# Patient Record
Sex: Female | Born: 1970
Health system: Southern US, Community
[De-identification: ages and names within clinical notes are randomized; demographics above are authoritative.]

## PROBLEM LIST (undated history)

## (undated) DIAGNOSIS — T7840XA Allergy, unspecified, initial encounter: Secondary | ICD-10-CM

## (undated) HISTORY — DX: Allergy, unspecified, initial encounter: T78.40XA

## (undated) HISTORY — PX: EYE SURGERY: SHX253

---

## 2007-01-06 ENCOUNTER — Ambulatory Visit (HOSPITAL_COMMUNITY): Admission: RE | Admit: 2007-01-06 | Discharge: 2007-01-06 | Payer: Self-pay | Admitting: *Deleted

## 2007-01-20 ENCOUNTER — Ambulatory Visit (HOSPITAL_COMMUNITY): Admission: RE | Admit: 2007-01-20 | Discharge: 2007-01-20 | Payer: Self-pay | Admitting: *Deleted

## 2007-06-15 ENCOUNTER — Inpatient Hospital Stay (HOSPITAL_COMMUNITY): Admission: AD | Admit: 2007-06-15 | Discharge: 2007-06-18 | Payer: Self-pay | Admitting: Obstetrics and Gynecology

## 2007-06-15 ENCOUNTER — Encounter (INDEPENDENT_AMBULATORY_CARE_PROVIDER_SITE_OTHER): Payer: Self-pay | Admitting: Obstetrics and Gynecology

## 2010-09-10 ENCOUNTER — Other Ambulatory Visit: Payer: Self-pay | Admitting: Emergency Medicine

## 2010-09-10 ENCOUNTER — Ambulatory Visit
Admission: RE | Admit: 2010-09-10 | Discharge: 2010-09-10 | Disposition: A | Discharge status: Home / Self Care | Payer: BC Managed Care – PPO | Source: Ambulatory Visit | Attending: Emergency Medicine | Admitting: Emergency Medicine

## 2010-09-10 DIAGNOSIS — R911 Solitary pulmonary nodule: Secondary | ICD-10-CM

## 2010-10-02 NOTE — H&P (Signed)
NAME:  Misty Choi, Misty Choi            ACCOUNT NO.:  [id number]   MEDICAL RECORD NO.:  [id number]          PATIENT TYPE:  INP   LOCATION:  9168                          FACILITY:  [facility]   PHYSICIAN:  Lenoard Aden, M.D.DATE OF BIRTH:  January 18, 1971   DATE OF ADMISSION:  06/15/2007  DATE OF DISCHARGE:                              HISTORY & PHYSICAL   CHIEF COMPLAINT:  BPP 6/10.   HISTORY OF PRESENT ILLNESS:  She is a 40 year old Hispanic female G1, P0  at [redacted] weeks gestation with an AFI of 3.2, nonreactive NST and a BPP at  6/10 for induction.   MEDICATIONS:  Prenatal vitamins.   PAST MEDICAL HISTORY:  1. History of wisdom tooth extraction.  2. LEEP.   SOCIAL HISTORY:  She is a nonsmoker, nondrinker, denies domestic or  physical violence.  Social  history is noncontributory.   FAMILY HISTORY:  Hypertension, stroke, diabetes, thyroid disease.   PRENATAL COURSE:  Uncomplicated to date.   PHYSICAL EXAMINATION:  GENERAL APPEARANCE:  She is a well-developed,  well-nourished, Hispanic female in no acute distress.  HEENT:  Normal.  LUNGS:  Clear.  CARDIOVASCULAR:  Regular rate and rhythm.  ABDOMEN:  Soft, gravid and nontender.  Estimated fetal weight 7-1/2  pounds.  PELVIC:  Cervix is closed, 80%, vertex, 0 station.  EXTREMITIES:  No cords.  NEUROLOGIC:  Nonfocal.  SKIN:  Intact.   IMPRESSION:  1. A 39-week intrauterine pregnancy.  2. Nonreactive non-stress test with biophysical profile 6/10 and      oligohydramnios for induction.   PLAN:  Proceed with induction.  Risks and benefits discussed.      Lenoard Aden, M.D.  Electronically Signed     RJT/MEDQ  D:  06/15/2007  T:  06/15/2007  Job:  [id number]

## 2010-10-02 NOTE — Op Note (Signed)
NAME:  Misty Choi, Misty Choi            ACCOUNT NO.:  [id number]   MEDICAL RECORD NO.:  [id number]          PATIENT TYPE:  INP   LOCATION:  9148                          [facility]ILITY:  [facility]   PHYSICIAN:  Lenoard Aden, M.D.DATE OF BIRTH:  [birth year]   DATE OF PROCEDURE:  06/15/2007  DATE OF DISCHARGE:                               OPERATIVE REPORT   PREOPERATIVE DIAGNOSES:  Term intrauterine pregnancy, nonreassuring  fetal heart rate tracing.   POSTOPERATIVE DIAGNOSES:  Term intrauterine pregnancy, nonreassuring  fetal heart rate tracing.   PROCEDURE:  Urgent low segment transverse cesarean section.   SURGEON:  Taavon.   ASSISTANT:  Fredric Mare.   ANESTHESIA:  Spinal by Jean Rosenthal.   ESTIMATED BLOOD LOSS:  1000 mL.   COMPLICATIONS:  None.   DRAINS:  Foley.   COUNTS:  Correct.   Patient to recovery in good condition.   FINDINGS:  Full-term living female, Apgars 8 and 49.  Cord pH 7.26.  Placenta to Pathology.  Terminal meconium noted.   BRIEF OPERATIVE NOTE:  After being apprised of the risks of anesthesia,  infection, bleeding, injury of abdominal organs, need for repair,  delayed versus immediate complications including bowel or bladder  injury, the patient was brought to the operating room where she was  administered a spinal anesthetic without complications, prepped and  draped in the usual sterile fashion.  Foley catheter was placed.  A  Pfannenstiel incision skin incision made with the scalpel, carried down  to fascia  which was nicked in the midline and entered transversely  using Mayo scissors.  Rectus muscles dissected sharply in the midline,  peritoneum entered sharply and bladder blade placed.  Visceral  peritoneum scored sharply off the lower segment uterine segment.  Kerr  hysterotomy incision made.  Atraumatic delivery of a full-term living  female as noted, handed to pediatrician in attendance.  Apgars 9.  Cord  blood collected.  Placenta delivered manually intact.   Three-vessel cord  noted.  Uterus exteriorized, curetted using dry lap pack and closed in  two running imbricating layers of 0 Monocryl suture.  Good hemostasis  was noted.  After achieving adequate hemostasis, the bladder flap was  inspected, found to be hemostatic.  Irrigation accomplished.  Fascia closed using a 0 Monocryl in a continuous running fashion.  Subcutaneous tissue reapproximated using a 0 plain in a continuous  running fashion.  Skin closed using staples.  The patient tolerated the  procedure well and transferred to recovery in good condition.      Lenoard Aden, M.D.  Electronically Signed     RJT/MEDQ  D:  06/15/2007  T:  06/16/2007  Job:  [id number]

## 2010-10-05 NOTE — Discharge Summary (Signed)
NAME:  Misty Choi, Misty Choi            ACCOUNT NO.:  [id number]   MEDICAL RECORD NO.:  [id number]          PATIENT TYPE:  INP   LOCATION:  9148                          [facility]ILITY:  [facility]   PHYSICIAN:  Lenoard Aden, M.D.DATE OF BIRTH:  July 23, 1970   DATE OF ADMISSION:  06/15/2007  DATE OF DISCHARGE:  06/18/2007                               DISCHARGE SUMMARY   The patient underwent uncomplicated repeat low-segment transverse  cesarean.  Postoperative course uncomplicated.  Discharged to home on  day 3.  Discharge teaching done.   Tylox, prenatal vitamins, and iron given.   Follow up in the office in 4-6 weeks.      Lenoard Aden, M.D.  Electronically Signed     RJT/MEDQ  D:  07/31/2007  T:  08/01/2007  Job:  [id number]

## 2011-02-07 LAB — CBC
Hemoglobin: 13.1
MCHC: 34.4
MCHC: 35.3
MCV: 94.5
MCV: 95.4
Platelets: 192
RDW: 13.9
RDW: 13.9
WBC: 10.5

## 2011-02-07 LAB — CCBB MATERNAL DONOR DRAW

## 2011-08-08 ENCOUNTER — Ambulatory Visit (INDEPENDENT_AMBULATORY_CARE_PROVIDER_SITE_OTHER): Payer: BC Managed Care – PPO | Admitting: Physician Assistant

## 2011-08-08 ENCOUNTER — Encounter: Payer: Self-pay | Admitting: Physician Assistant

## 2011-08-08 VITALS — BP 124/77 | HR 105 | Temp 100.8°F | Resp 16 | Ht 63.5 in | Wt 193.6 lb

## 2011-08-08 DIAGNOSIS — R300 Dysuria: Secondary | ICD-10-CM

## 2011-08-08 DIAGNOSIS — R509 Fever, unspecified: Secondary | ICD-10-CM

## 2011-08-08 DIAGNOSIS — N949 Unspecified condition associated with female genital organs and menstrual cycle: Secondary | ICD-10-CM

## 2011-08-08 DIAGNOSIS — N390 Urinary tract infection, site not specified: Secondary | ICD-10-CM

## 2011-08-08 LAB — POCT CBC
Hemoglobin: 13.8 g/dL (ref 12.2–16.2)
MCH, POC: 30.3 pg (ref 27–31.2)
MCHC: 32.9 g/dL (ref 31.8–35.4)
MID (cbc): 0.6 (ref 0–0.9)
MPV: 9.2 fL (ref 0–99.8)
POC Granulocyte: 4.1 (ref 2–6.9)
POC MID %: 10.4 %M (ref 0–12)
Platelet Count, POC: 264 10*3/uL (ref 142–424)
RBC: 4.55 M/uL (ref 4.04–5.48)

## 2011-08-08 LAB — POCT URINALYSIS DIPSTICK
Glucose, UA: NEGATIVE
Nitrite, UA: NEGATIVE
Protein, UA: NEGATIVE
Spec Grav, UA: 1.015
Urobilinogen, UA: 0.2
pH, UA: 6

## 2011-08-08 LAB — POCT UA - MICROSCOPIC ONLY
Casts, Ur, LPF, POC: NEGATIVE
Crystals, Ur, HPF, POC: NEGATIVE
Yeast, UA: NEGATIVE

## 2011-08-08 MED ORDER — NITROFURANTOIN MONOHYD MACRO 100 MG PO CAPS: 100.0000 mg | ORAL_CAPSULE | Freq: Two times a day (BID) | ORAL | Status: AC

## 2011-08-08 MED ORDER — AZITHROMYCIN 500 MG PO TABS: ORAL_TABLET | ORAL | Status: DC

## 2011-08-08 MED ORDER — CEFIXIME 400 MG PO TABS: 400.0000 mg | ORAL_TABLET | Freq: Every day | ORAL | Status: AC

## 2011-08-08 NOTE — Progress Notes (Signed)
  Subjective:    Patient ID: Misty Choi, female    DOB: [birth year], 42 y.o.   MRN: [id number]  HPI 41 yo female c/o 2 week h/o general body aches. Also she has been having some mild dysuria. No cough.  No night sweats.  Didn't develop fever until today.  Review of Systems  All other systems reviewed and are negative.       Objective:   Physical Exam  Nursing note and vitals reviewed. Constitutional: She is oriented to person, place, and time. She appears well-developed and well-nourished.  HENT:  Head: Normocephalic and atraumatic.  Mouth/Throat: Oropharynx is clear and moist.  Neck: Normal range of motion. Neck supple. No thyromegaly present.  Cardiovascular: Normal rate, regular rhythm and normal heart sounds.   Pulmonary/Chest: Effort normal and breath sounds normal. No respiratory distress. She has no wheezes. She has no rales.  Abdominal: Soft. Bowel sounds are normal. She exhibits no distension. There is no tenderness (no CVA tenderness).  Lymphadenopathy:    She has no cervical adenopathy.  Neurological: She is alert and oriented to person, place, and time.  Skin: Skin is warm and dry.      Results for orders placed in visit on 08/08/11  POCT URINALYSIS DIPSTICK      Component Value Range   Color, UA yellow     Clarity, UA slightly cloudy     Glucose, UA neg     Bilirubin, UA neg     Ketones, UA neg     Spec Grav, UA 1.015     Blood, UA trace     pH, UA 6.0     Protein, UA neg     Urobilinogen, UA 0.2     Nitrite, UA neg     Leukocytes, UA Trace    POCT UA - MICROSCOPIC ONLY      Component Value Range   WBC, Ur, HPF, POC 0-2     RBC, urine, microscopic 4.5     Bacteria, U Microscopic 2+     Mucus, UA neg     Epithelial cells, urine per micros 0-2     Crystals, Ur, HPF, POC neg     Casts, Ur, LPF, POC neg     Yeast, UA neg    POCT CBC      Component Value Range   WBC 5.4  4.6 - 10.2 (K/uL)   Lymph, poc 0.8  0.6 - 3.4    POC LYMPH PERCENT 14.6   10 - 50 (%L)   MID (cbc) 0.6  0 - 0.9    POC MID % 10.4  0 - 12 (%M)   POC Granulocyte 4.1  2 - 6.9    Granulocyte percent 75.0  37 - 80 (%G)   RBC 4.55  4.04 - 5.48 (M/uL)   Hemoglobin 13.8  12.2 - 16.2 (g/dL)   HCT, POC 40.9  81.1 - 47.9 (%)   MCV 92.2  80 - 97 (fL)   MCH, POC 30.3  27 - 31.2 (pg)   MCHC 32.9  31.8 - 35.4 (g/dL)   RDW, POC 91.4     Platelet Count, POC 264  142 - 424 (K/uL)   MPV 9.2  0 - 99.8 (fL)      Assessment & Plan:  ?UTI vs STI-Increase fluids See AVS/labs

## 2011-08-08 NOTE — Patient Instructions (Signed)
Increase fluids. To ER if worsens, RTC if new symptoms develop or you worsen.

## 2011-08-09 LAB — GC/CHLAMYDIA PROBE AMP, URINE: Chlamydia, Swab/Urine, PCR: NEGATIVE

## 2011-08-10 LAB — URINE CULTURE: Colony Count: >=100000

## 2012-04-24 ENCOUNTER — Ambulatory Visit (INDEPENDENT_AMBULATORY_CARE_PROVIDER_SITE_OTHER): Payer: BC Managed Care – PPO | Admitting: Emergency Medicine

## 2012-04-24 VITALS — BP 122/90 | HR 83 | Temp 98.3°F | Resp 16 | Ht 64.0 in | Wt 197.0 lb

## 2012-04-24 DIAGNOSIS — N3090 Cystitis, unspecified without hematuria: Secondary | ICD-10-CM

## 2012-04-24 DIAGNOSIS — B86 Scabies: Secondary | ICD-10-CM

## 2012-04-24 MED ORDER — PERMETHRIN 5 % EX CREA: TOPICAL_CREAM | Freq: Once | CUTANEOUS | Status: DC

## 2012-04-24 MED ORDER — TRIAMCINOLONE ACETONIDE 0.1 % EX CREA: TOPICAL_CREAM | Freq: Two times a day (BID) | CUTANEOUS | Status: DC

## 2012-04-24 NOTE — Patient Instructions (Addendum)
Scabies Scabies are small bugs (mites) that burrow under the skin and cause red bumps and severe itching. These bugs can only be seen with a microscope. Scabies are highly contagious. They can spread easily from person to person by direct contact. They are also spread through sharing clothing or linens that have the scabies mites living in them. It is not unusual for an entire family to become infected through shared towels, clothing, or bedding.  HOME CARE INSTRUCTIONS   Your caregiver may prescribe a cream or lotion to kill the mites. If cream is prescribed, massage the cream into the entire body from the neck to the bottom of both feet. Also massage the cream into the scalp and face if your child is less than 1 year old. Avoid the eyes and mouth. Do not wash your hands after application.  Leave the cream on for 8 to 12 hours. Your child should bathe or shower after the 8 to 12 hour application period. Sometimes it is helpful to apply the cream to your child right before bedtime.  One treatment is usually effective and will eliminate approximately 95% of infestations. For severe cases, your caregiver may decide to repeat the treatment in 1 week. Everyone in your household should be treated with one application of the cream.  New rashes or burrows should not appear within 24 to 48 hours after successful treatment. However, the itching and rash may last for 2 to 4 weeks after successful treatment. Your caregiver may prescribe a medicine to help with the itching or to help the rash go away more quickly.  Scabies can live on clothing or linens for up to 3 days. All of your child's recently used clothing, towels, stuffed toys, and bed linens should be washed in hot water and then dried in a dryer for at least 20 minutes on high heat. Items that cannot be washed should be enclosed in a plastic bag for at least 3 days.  To help relieve itching, bathe your child in a cool bath or apply cool washcloths to the  affected areas.  Your child may return to school after treatment with the prescribed cream. SEEK MEDICAL CARE IF:   The itching persists longer than 4 weeks after treatment.  The rash spreads or becomes infected. Signs of infection include red blisters or yellow-tan crust. Document Released: 05/06/2005 Document Revised: 07/29/2011 Document Reviewed: 09/14/2008 [employer] Patient Information 2013 [employer].  

## 2012-04-24 NOTE — Progress Notes (Signed)
[facility] [address] [place] [address] [phone]  Date:  04/24/2012   Name:  Misty Choi   DOB:  29-Sep-1970   MRN:  [id number]  PCP:  No primary provider on file.    Chief Complaint: Rash and Dizziness   History of Present Illness:  Misty Choi is a 41 y.o. very pleasant female patient who presents with the following:  Three day history of pruritic rash in axillae, arms, and feet.  Intensely pruritic.  No allergen contact.  Denies sick contacts.  There is no problem list on file for this patient.   History reviewed. No pertinent past medical history.  History reviewed. No pertinent past surgical history.  History  Substance Use Topics  . Smoking status: Never Smoker   . Smokeless tobacco: Not on file  . Alcohol Use: Yes    History reviewed. No pertinent family history.  Allergies  Allergen Reactions  . Nitrofurantoin Monohyd Macro Rash    Medication list has been reviewed and updated.  Current Outpatient Prescriptions on File Prior to Visit  Medication Sig Dispense Refill  . azithromycin (ZITHROMAX) 500 MG tablet 2 po with food today  2 tablet  0    Review of Systems:  As per HPI, otherwise negative.    Physical Examination: Filed Vitals:   04/24/12 1019  BP: 122/90  Pulse: 83  Temp: 98.3 F (36.8 C)  Resp: 16   Filed Vitals:   04/24/12 1019  Height: 5\' 4"  (1.626 m)  Weight: 197 lb (89.359 kg)   Body mass index is 33.82 kg/(m^2). Ideal Body Weight: Weight in (lb) to have BMI = 25: 145.3    GEN: WDWN, NAD, Non-toxic, Alert & Oriented x 3 HEENT: Atraumatic, Normocephalic.  Ears and Nose: No external deformity. EXTR: No clubbing/cyanosis/edema NEURO: Normal gait.  PSYCH: Normally interactive. Conversant. Not depressed or anxious appearing.  Calm demeanor.  SKIN:  1 mm diameter erythematous eruption. Coalescent.  Characteristic scabies  Assessment and Plan: Scabies elimite TAC Follow up as  needed  Carmelina Dane, MD

## 2012-04-27 NOTE — Progress Notes (Signed)
Reviewed and agree.

## 2012-10-30 ENCOUNTER — Ambulatory Visit (INDEPENDENT_AMBULATORY_CARE_PROVIDER_SITE_OTHER): Payer: BC Managed Care – PPO | Admitting: Physician Assistant

## 2012-10-30 VITALS — BP 121/86 | HR 77 | Temp 98.9°F | Resp 18 | Wt 199.0 lb

## 2012-10-30 DIAGNOSIS — R300 Dysuria: Secondary | ICD-10-CM

## 2012-10-30 LAB — POCT URINALYSIS DIPSTICK
Ketones, UA: NEGATIVE
Nitrite, UA: NEGATIVE
Protein, UA: NEGATIVE
pH, UA: 5.5

## 2012-10-30 LAB — POCT UA - MICROSCOPIC ONLY
Crystals, Ur, HPF, POC: NEGATIVE
Yeast, UA: NEGATIVE

## 2012-10-30 MED ORDER — PHENAZOPYRIDINE HCL 200 MG PO TABS: 200.0000 mg | ORAL_TABLET | Freq: Three times a day (TID) | ORAL | Status: DC | PRN

## 2012-10-30 MED ORDER — CIPROFLOXACIN HCL 500 MG PO TABS: 500.0000 mg | ORAL_TABLET | Freq: Two times a day (BID) | ORAL | Status: DC

## 2012-10-30 NOTE — Patient Instructions (Addendum)
Begin taking the antibiotic as directed.  Be sure to finish the full course.  You may take pyridium every 8 hours if needed today and tomorrow to help with symptoms.  Plenty of fluids (water is best!)  I will let you know when your culture is back and if we need to make any changed based on that.  Please let me know if any symptoms are worsening or not improving.   Urinary Tract Infection Urinary tract infections (UTIs) can develop anywhere along your urinary tract. Your urinary tract is your body's drainage system for removing wastes and extra water. Your urinary tract includes two kidneys, two ureters, a bladder, and a urethra. Your kidneys are a pair of bean-shaped organs. Each kidney is about the size of your fist. They are located below your ribs, one on each side of your spine. CAUSES Infections are caused by microbes, which are microscopic organisms, including fungi, viruses, and bacteria. These organisms are so small that they can only be seen through a microscope. Bacteria are the microbes that most commonly cause UTIs. SYMPTOMS  Symptoms of UTIs may vary by age and gender of the patient and by the location of the infection. Symptoms in young women typically include a frequent and intense urge to urinate and a painful, burning feeling in the bladder or urethra during urination. Older women and men are more likely to be tired, shaky, and weak and have muscle aches and abdominal pain. A fever may mean the infection is in your kidneys. Other symptoms of a kidney infection include pain in your back or sides below the ribs, nausea, and vomiting. DIAGNOSIS To diagnose a UTI, your caregiver will ask you about your symptoms. Your caregiver also will ask to provide a urine sample. The urine sample will be tested for bacteria and white blood cells. White blood cells are made by your body to help fight infection. TREATMENT  Typically, UTIs can be treated with medication. Because most UTIs are caused by a  bacterial infection, they usually can be treated with the use of antibiotics. The choice of antibiotic and length of treatment depend on your symptoms and the type of bacteria causing your infection. HOME CARE INSTRUCTIONS  If you were prescribed antibiotics, take them exactly as your caregiver instructs you. Finish the medication even if you feel better after you have only taken some of the medication.  Drink enough water and fluids to keep your urine clear or pale yellow.  Avoid caffeine, tea, and carbonated beverages. They tend to irritate your bladder.  Empty your bladder often. Avoid holding urine for long periods of time.  Empty your bladder before and after sexual intercourse.  After a bowel movement, women should cleanse from front to back. Use each tissue only once. SEEK MEDICAL CARE IF:   You have back pain.  You develop a fever.  Your symptoms do not begin to resolve within 3 days. SEEK IMMEDIATE MEDICAL CARE IF:   You have severe back pain or lower abdominal pain.  You develop chills.  You have nausea or vomiting.  You have continued burning or discomfort with urination. MAKE SURE YOU:   Understand these instructions.  Will watch your condition.  Will get help right away if you are not doing well or get worse. Document Released: 02/13/2005 Document Revised: 11/05/2011 Document Reviewed: 06/14/2011 [facility] Patient Information 2014 [place], [place].

## 2012-10-30 NOTE — Progress Notes (Signed)
Subjective:    Patient ID: Misty Choi, female    DOB: 11/24/1970, 42 y.o.   MRN: [id number]  HPI   Ms. Misty Choi is a very pleasant 42 yr old female here with concern for bladder infection.  States she has had about 3 days of burning with urination and frequency.  Has also felt fatigued.  Abnormal odor to urine.  Some lower abd pain.  No back pain, fever, or chills.  A little nausea but no vomiting.  No hematuria.  No vaginal symptoms or concern for STI.    Has had UTIs in the past - maybe 1 every couple years.  Symptoms today feel similar to previous UTIs.   Review of Systems  Constitutional: Positive for fatigue. Negative for fever and chills.  HENT: Negative.   Respiratory: Negative.   Cardiovascular: Negative.   Gastrointestinal: Positive for nausea and abdominal pain (lower). Negative for vomiting.  Genitourinary: Positive for dysuria, urgency and frequency. Negative for hematuria, flank pain and vaginal discharge.  Musculoskeletal: Negative.   Skin: Negative.   Neurological: Negative.        Objective:   Physical Exam  Vitals reviewed. Constitutional: She is oriented to person, place, and time. She appears well-developed and well-nourished. No distress.  HENT:  Head: Normocephalic and atraumatic.  Eyes: Conjunctivae are normal. No scleral icterus.  Cardiovascular: Normal rate, regular rhythm and normal heart sounds.  Exam reveals no gallop and no friction rub.   No murmur heard. Pulmonary/Chest: Effort normal and breath sounds normal. She has no wheezes. She has no rales.  Abdominal: Soft. Bowel sounds are normal. There is tenderness in the suprapubic area. There is no rigidity, no rebound, no guarding, no CVA tenderness, no tenderness at McBurney's point and negative Murphy's sign.  Neurological: She is alert and oriented to person, place, and time.  Skin: Skin is warm and dry.  Psychiatric: She has a normal mood and affect. Her behavior is normal.    Results for  orders placed in visit on 10/30/12  POCT UA - MICROSCOPIC ONLY      Result Value Range   WBC, Ur, HPF, POC 8-12     RBC, urine, microscopic 1-3     Bacteria, U Microscopic small     Mucus, UA neg     Epithelial cells, urine per micros 1-3     Crystals, Ur, HPF, POC neg     Casts, Ur, LPF, POC neg     Yeast, UA neg    POCT URINALYSIS DIPSTICK      Result Value Range   Color, UA yellow     Clarity, UA hazy     Glucose, UA neg     Bilirubin, UA neg     Ketones, UA neg     Spec Grav, UA <=1.005     Blood, UA 5.5     pH, UA 5.5     Protein, UA neg     Urobilinogen, UA 0.2     Nitrite, UA neg     Leukocytes, UA small (1+)           Assessment & Plan:  Dysuria - Plan: POCT UA - Microscopic Only, POCT urinalysis dipstick, Urine culture, ciprofloxacin (CIPRO) 500 MG tablet, phenazopyridine (PYRIDIUM) 200 MG tablet   Ms. Misty Choi is a very pleasant 42 yr old female with 3 days of UTI symptoms.  UA shows only 1+ leuks, but symptoms are certainly suggestive of infection.  Will send urine cx and start  abx empirically.  Pt has an allergy to macrobid, so will treat with Cipro BID x 5 days.  Pyridium q8h prn symptoms for the next 48 hours.  Push fluids.  RTC precautions.  Will follow up on culture data and adjust therapy if necessary.

## 2012-11-01 LAB — URINE CULTURE

## 2014-07-06 DIAGNOSIS — Z8619 Personal history of other infectious and parasitic diseases: Secondary | ICD-10-CM | POA: Insufficient documentation

## 2014-07-28 ENCOUNTER — Ambulatory Visit (INDEPENDENT_AMBULATORY_CARE_PROVIDER_SITE_OTHER): Payer: 59 | Admitting: Physician Assistant

## 2014-07-28 VITALS — BP 150/80 | HR 117 | Temp 99.7°F | Resp 16 | Ht 64.0 in | Wt 193.0 lb

## 2014-07-28 DIAGNOSIS — J069 Acute upper respiratory infection, unspecified: Secondary | ICD-10-CM | POA: Diagnosis not present

## 2014-07-28 DIAGNOSIS — R350 Frequency of micturition: Secondary | ICD-10-CM

## 2014-07-28 DIAGNOSIS — R6889 Other general symptoms and signs: Secondary | ICD-10-CM | POA: Diagnosis not present

## 2014-07-28 DIAGNOSIS — B9789 Other viral agents as the cause of diseases classified elsewhere: Secondary | ICD-10-CM

## 2014-07-28 LAB — POCT INFLUENZA A/B
INFLUENZA B, POC: NEGATIVE
Influenza A, POC: NEGATIVE

## 2014-07-28 LAB — POCT URINALYSIS DIPSTICK
BILIRUBIN UA: NEGATIVE
Blood, UA: NEGATIVE
Glucose, UA: NEGATIVE
KETONES UA: NEGATIVE
Leukocytes, UA: NEGATIVE
NITRITE UA: NEGATIVE
PH UA: 8.5
Protein, UA: NEGATIVE
SPEC GRAV UA: 1.02
UROBILINOGEN UA: 0.2

## 2014-07-28 LAB — POCT UA - MICROSCOPIC ONLY
Bacteria, U Microscopic: NEGATIVE
CASTS, UR, LPF, POC: NEGATIVE
CRYSTALS, UR, HPF, POC: NEGATIVE
MUCUS UA: NEGATIVE
RBC, URINE, MICROSCOPIC: NEGATIVE
YEAST UA: NEGATIVE

## 2014-07-28 MED ORDER — GUAIFENESIN ER 1200 MG PO TB12: 1.0000 | ORAL_TABLET | Freq: Two times a day (BID) | ORAL | Status: AC

## 2014-07-28 MED ORDER — BENZONATATE 100 MG PO CAPS: ORAL_CAPSULE | ORAL | Status: AC

## 2014-07-28 MED ORDER — HYDROCOD POLST-CHLORPHEN POLST 10-8 MG/5ML PO LQCR: 5.0000 mL | Freq: Two times a day (BID) | ORAL | Status: AC | PRN

## 2014-07-28 NOTE — Progress Notes (Signed)
Subjective:    Patient ID: Misty Choi, female    DOB: 1970-08-12, 44 y.o.   MRN: [id number]  HPI Pt presents to clinic with 12 hours of feeling poorly - it started abruptly and has continued to worsened.  She has mild congestion but fevers and chills and myalgias are the worse.  She has a cough that is only productive in the am.   She has also noticed that she has been urinating more than normal this am but she is having no other urinary symptoms.  No flu vaccine this year  No OTC meds Works in an elementary school  No h/o asthma and she is not a smoker  Review of Systems  Constitutional: Positive for fever (Tmax 100.7) and chills.  HENT: Positive for congestion and rhinorrhea (clear). Negative for sore throat.   Respiratory: Positive for cough (light yellow - worse in the am).   Gastrointestinal: Negative for nausea, vomiting and diarrhea.  Musculoskeletal: Positive for myalgias.  Neurological: Negative for headaches.  Psychiatric/Behavioral: Negative for sleep disturbance.   There are no active problems to display for this patient.  Prior to Admission medications   Not on File   Allergies  Allergen Reactions  . Azithromycin Rash  . Nitrofurantoin Monohyd Macro Rash    Medications, allergies, past medical history, surgical history, family history, social history and problem list reviewed and updated.      Objective:   Physical Exam  Constitutional: She is oriented to person, place, and time. She appears well-developed and well-nourished.  BP 150/80 mmHg  Pulse 117  Temp(Src) 99.7 F (37.6 C) (Oral)  Resp 16  Ht  (1.626 m)  Wt 193 lb (87.544 kg)  BMI 33.11 kg/m2  SpO2 97%  LMP 07/19/2014   HENT:  Head: Normocephalic and atraumatic.  Right Ear: External ear normal.  Left Ear: External ear normal.  Eyes: Conjunctivae are normal.  Neck: Normal range of motion.  Cardiovascular: Normal rate, regular rhythm and normal heart sounds.   No murmur  heard. Pulmonary/Chest: Effort normal and breath sounds normal. She has no wheezes.  Deep sounding cough  Lymphadenopathy:    She has cervical adenopathy (AC enlarged but not TTP bilaterally).  Neurological: She is alert and oriented to person, place, and time.  Skin: Skin is warm and dry.  Psychiatric: She has a normal mood and affect. Her behavior is normal. Judgment and thought content normal.   Results for orders placed or performed in visit on 07/28/14  POCT Influenza A/B  Result Value Ref Range   Influenza A, POC Negative    Influenza B, POC Negative   POCT urinalysis dipstick  Result Value Ref Range   Color, UA yellow    Clarity, UA clear    Glucose, UA neg    Bilirubin, UA neg    Ketones, UA neg    Spec Grav, UA 1.020    Blood, UA neg    pH, UA 8.5    Protein, UA neg    Urobilinogen, UA 0.2    Nitrite, UA neg    Leukocytes, UA Negative   POCT UA - Microscopic Only  Result Value Ref Range   WBC, Ur, HPF, POC 0-1    RBC, urine, microscopic neg    Bacteria, U Microscopic neg    Mucus, UA neg    Epithelial cells, urine per micros 0-2    Crystals, Ur, HPF, POC neg    Casts, Ur, LPF, POC neg  Yeast, UA neg       Assessment & Plan:  Flu-like symptoms - Plan: POCT Influenza A/B  Urinary frequency - Plan: POCT urinalysis dipstick, POCT UA - Microscopic Only  Viral URI with cough - Plan: Guaifenesin (MUCINEX MAXIMUM STRENGTH) 1200 MG TB12, benzonatate (TESSALON) 100 MG capsule, chlorpheniramine-HYDROcodone (TUSSIONEX PENNKINETIC ER) 10-8 MG/5ML [facility]  Symptomatic care  Benny LennertSarah Adelise Buswell PA-C  [facility] 07/28/2014 6:29 PM

## 2014-07-28 NOTE — Patient Instructions (Signed)
Please push fluids.  Tylenol and Motrin for fever and body aches.    

## 2015-07-18 ENCOUNTER — Ambulatory Visit (INDEPENDENT_AMBULATORY_CARE_PROVIDER_SITE_OTHER): Payer: Self-pay | Admitting: Urgent Care

## 2015-07-18 VITALS — BP 120/80 | HR 106 | Temp 99.2°F | Resp 16 | Ht 63.78 in | Wt 201.8 lb

## 2015-07-18 DIAGNOSIS — S0082XA Blister (nonthermal) of other part of head, initial encounter: Secondary | ICD-10-CM

## 2015-07-18 DIAGNOSIS — R51 Headache: Secondary | ICD-10-CM

## 2015-07-18 DIAGNOSIS — L539 Erythematous condition, unspecified: Secondary | ICD-10-CM

## 2015-07-18 DIAGNOSIS — S00521A Blister (nonthermal) of lip, initial encounter: Secondary | ICD-10-CM

## 2015-07-18 DIAGNOSIS — R519 Headache, unspecified: Secondary | ICD-10-CM

## 2015-07-18 MED ORDER — ACYCLOVIR 800 MG PO TABS: 800.0000 mg | ORAL_TABLET | Freq: Every day | ORAL | Status: DC

## 2015-07-18 MED ORDER — TRAMADOL HCL 50 MG PO TABS: 50.0000 mg | ORAL_TABLET | Freq: Three times a day (TID) | ORAL | Status: DC | PRN

## 2015-07-18 MED ORDER — MUPIROCIN 2 % EX OINT: 1.0000 "application " | TOPICAL_OINTMENT | Freq: Three times a day (TID) | CUTANEOUS | Status: DC

## 2015-07-18 MED ORDER — VALACYCLOVIR HCL 1 G PO TABS: 1000.0000 mg | ORAL_TABLET | Freq: Three times a day (TID) | ORAL | Status: DC

## 2015-07-18 NOTE — Progress Notes (Signed)
    MRN: [id number] DOB: February 25, 1971  Subjective:   Misty Choi is a 45 y.o. female presenting for chief complaint of Rash and Mouth Lesions  Reports 3 day history of warmth over her left face, followed by blisters over her cheek that are tingling and painful, feels tightness around her neck, lymph node pain behind her left ear. Has not tried any medications for relief. Denies fever, sore throat, eye pain, eye redness, sinus pain, cough, chest pain, shob, chest tightness, n/v, abdominal pain. Denies spending time outdoors, new exposures including medications, foods and detergents. Admits history of chicken pox as a child. Patient works with kindergartners.   Gwenette currently has no medications in their medication list. Also is allergic to azithromycin and nitrofurantoin monohyd macro.  Zani  has a past medical history of Allergy. Also  has past surgical history that includes Eye surgery and Cesarean section.  Objective:   Vitals: BP 120/80 mmHg  Pulse 106  Temp(Src) 99.2 F (37.3 C) (Oral)  Resp 16  Ht 5' 3.78" (1.62 m)  Wt 201 lb 12.8 oz (91.536 kg)  BMI 34.88 kg/m2  SpO2 98%  LMP 06/20/2015  Physical Exam  Constitutional: She is oriented to person, place, and time. She appears well-developed and well-nourished.  HENT:  Head:    TM's intact bilaterally, no effusions or erythema. Nasal turbinates pink and moist, nasal passages patent. No sinus tenderness. Oropharynx clear, mucous membranes moist, dentition in good repair.  Eyes: Pupils are equal, round, and reactive to light. Right eye exhibits no discharge. Left eye exhibits no discharge. No scleral icterus.  Neck: Normal range of motion. Neck supple.  Cardiovascular: Normal rate, regular rhythm and intact distal pulses.   Pulmonary/Chest: No respiratory distress. She has no wheezes. She has no rales.  Lymphadenopathy:    She has cervical adenopathy (bilateral, L>R, anterior).  Neurological: She is alert and oriented  to person, place, and time.  Skin: Skin is warm and dry.   Assessment and Plan :   1. Facial erythema 2. Blister, face, initial encounter 3. Blister of lip without infection, initial encounter 4. Facial pain - Likely undergoing Shingles, offered scripts for Valtrex and Acyclovir, she will fill the more affordable option. Offered Ultram for pain and Bactroban if blisters become infected, I do not suspect that they are there yet. RTC in 1 week if no improvement. Patient agreed.  [name], PA-C [facility] [phone] 07/18/2015 1:20 PM

## 2015-07-18 NOTE — Patient Instructions (Signed)
Culebrilla  (Shingles)    La culebrilla, también conocida como herpes zóster, es una infección que causa una erupción dolorosa en la piel y ampollas llenas de líquido. La culebrilla no está relacionada con el herpes genital, que es una enfermedad de transmisión sexual.     Solo se manifiesta en personas que:  · Tuvieron varicela.  · Recibieron la vacuna contra la varicela. (Esto es poco frecuente).  CAUSAS  La causa de la culebrilla es el virus de la varicela zóster (VVZ), el mismo virus que causa la varicela. Después de la exposición al virus de la varicela zóster, este permanece en el organismo en un estado inactivo (latente). La culebrilla aparece si el virus se reactiva. Esto puede ocurrir muchos años después de la exposición inicial al virus. No se conocen las causas por las que este virus se reactiva.  FACTORES DE RIESGO  Las personas que tuvieron varicela o recibieron la vacuna contra la varicela están en riesgo de tener culebrilla. La infección es más frecuente en las personas que:  · Tienen más de 50 años.  · Tienen el sistema de defensa del organismo (sistema inmunitario) debilitado, como en los enfermos con VIH, sida o cáncer.  · Toman medicamentos que debilitan el sistema inmunitario, como los medicamentos para trasplantes.  · Están sometidas a un gran estrés.  SÍNTOMAS  Los primeros síntomas de esta afección pueden incluir picazón, hormigueo o dolor en una zona de la piel. Este dolor se puede describir como ardor, punzante o pulsátil.  Unos días o semanas después de que comienzan los síntomas, aparece una erupción rojiza y dolorosa en un lado del cuerpo en un patrón con forma de cinto o de banda. Finalmente, la erupción se convierte en ampollas llenas de líquido que se abren, forman costras y se secan en dos o tres semanas.  En cualquier momento durante la infección, puede presentar lo siguiente:  · Fiebre.  · Escalofríos.  · Dolor de cabeza.  · Malestar estomacal.  DIAGNÓSTICO  Esta afección se  diagnostica con un examen de la piel. En algunos casos, se extraen muestras de piel o del líquido de las ampollas antes de definir el diagnóstico. Estas muestras se examinan con el microscopio o se envían al laboratorio para su análisis.  TRATAMIENTO  No hay una cura específica para esta afección. El médico probablemente le recete medicamentos para ayudarlo a controlar el dolor, a recuperarse más rápido y a evitar problemas a largo plazo. Entre los medicamentos se incluyen los siguientes:  · Medicamentos antivirales.  · Antiinflamatorios.  · Analgésicos.  Si la zona afectada está en el rostro, podrán derivarlo a un especialista, como un médico especialista en ojos (oftalmólogo) o en oídos, nariz y garganta (otorrinolaringólogo) para evitar problemas oculares, dolor crónico o discapacidad.  INSTRUCCIONES PARA EL CUIDADO EN EL HOGAR  Medicamentos  · Tome los medicamentos solamente como se lo haya indicado el médico.  · Aplique una crema anestésica o una para calmar la picazón en la zona afectada según las indicaciones del médico.  Cuidado de la erupción y las ampollas  · Tome un baño de agua fría o aplique compresas frías en la zona de la erupción o las ampollas como se lo haya indicado el médico. Esto aliviará el dolor y la picazón.  · Mantenga la zona de la erupción cubierta con una venda (vendaje). Use ropa holgada para ayudar a aliviar el dolor del roce con la erupción.  · Mantenga la erupción y las ampollas limpias con jabón suave   y agua fresca o como se lo indique el médico.  · Controle la erupción todos los días para detectar signos de infección. Estos signos incluyen enrojecimiento, hinchazón y dolor que perdura o aumenta.  · No pellizque las ampollas.  · No se rasque la zona de la erupción.  Instrucciones generales  · Haga reposo según las indicaciones del médico.  · Concurra a todas las visitas de control como se lo haya indicado el médico. Esto es importante.  · Hasta tanto las ampollas formen costras, la  infección puede causar varicela en las personas que nunca la tuvieron o no se vacunaron contra la varicela. Para impedir que esto suceda, evite el contacto con otras personas, en especial:    Bebés.    Embarazadas.    Niños que tienen eccema.    Personas mayores que han recibido un trasplante.    Personas con enfermedades crónicas, como leucemia y sida.  SOLICITE ATENCIÓN MÉDICA SI:  · El dolor no se alivia con los medicamentos prescritos.  · El dolor no mejora después de que la erupción desaparece.  · La erupción parece infectada. Los signos de infección incluyen enrojecimiento, hinchazón y dolor que perdura o aumenta.  SOLICITE ATENCIÓN MÉDICA DE INMEDIATO SI:  · La erupción aparece en el rostro o la nariz.  · Tiene dolor en el rostro, en la zona de los ojos o tiene pérdida de la sensibilidad en un lado del rostro.  · Siente dolor o un zumbido en el oído.  · Tiene pérdida del gusto.  · La afección empeora.     Esta información no tiene como fin reemplazar el consejo del médico. Asegúrese de hacerle al médico cualquier pregunta que tenga.     Document Released: 02/13/2005 Document Revised: 05/27/2014  Elsevier Interactive Patient Education ©2016 Elsevier Inc.

## 2016-05-16 ENCOUNTER — Ambulatory Visit (INDEPENDENT_AMBULATORY_CARE_PROVIDER_SITE_OTHER): Payer: 59 | Admitting: Urgent Care

## 2016-05-16 VITALS — BP 118/76 | HR 105 | Temp 98.5°F | Resp 18 | Ht 63.78 in | Wt 199.0 lb

## 2016-05-16 DIAGNOSIS — R519 Headache, unspecified: Secondary | ICD-10-CM

## 2016-05-16 DIAGNOSIS — L539 Erythematous condition, unspecified: Secondary | ICD-10-CM

## 2016-05-16 DIAGNOSIS — R21 Rash and other nonspecific skin eruption: Secondary | ICD-10-CM

## 2016-05-16 DIAGNOSIS — R51 Headache: Secondary | ICD-10-CM | POA: Diagnosis not present

## 2016-05-16 LAB — POCT CBC
Granulocyte percent: 73.5 %G (ref 37–80)
HCT, POC: 39 % (ref 37.7–47.9)
HEMOGLOBIN: 13.9 g/dL (ref 12.2–16.2)
LYMPH, POC: 2.3 (ref 0.6–3.4)
MCH: 31.9 pg — AB (ref 27–31.2)
MCHC: 35.7 g/dL — AB (ref 31.8–35.4)
MCV: 89.5 fL (ref 80–97)
MID (CBC): 0.5 (ref 0–0.9)
MPV: 8.1 fL (ref 0–99.8)
PLATELET COUNT, POC: 308 10*3/uL (ref 142–424)
POC Granulocyte: 7.9 — AB (ref 2–6.9)
POC LYMPH PERCENT: 21.4 %L (ref 10–50)
POC MID %: 5.1 %M (ref 0–12)
RBC: 4.35 M/uL (ref 4.04–5.48)
RDW, POC: 13.2 %
WBC: 10.7 10*3/uL — AB (ref 4.6–10.2)

## 2016-05-16 MED ORDER — HYDROCODONE-ACETAMINOPHEN 5-325 MG PO TABS: 1.0000 | ORAL_TABLET | Freq: Four times a day (QID) | ORAL | 0 refills | Status: DC | PRN

## 2016-05-16 MED ORDER — VALACYCLOVIR HCL 1 G PO TABS: 1000.0000 mg | ORAL_TABLET | Freq: Three times a day (TID) | ORAL | 0 refills | Status: DC

## 2016-05-16 NOTE — Patient Instructions (Addendum)
Valacyclovir caplets Qu es este medicamento? El VALACICLOVIR es un medicamento antiviral. Se utiliza para tratar o prevenir infecciones provocadas por ciertos tipos de virus. Ejemplos de estas infecciones incluyen infecciones por herpes y [place]. Este medicamento no curar las infecciones de herpes. Este medicamento puede ser utilizado para otros usos; si tiene alguna pregunta consulte con su proveedor de atencin mdica o con su farmacutico. MARCAS COMUNES: Valtrex Qu le debo informar a mi profesional de la salud antes de tomar este medicamento? Necesita saber si usted presenta alguno de los siguientes problemas o situaciones: -sndrome de inmunodeficiencia adquirida (SIDA) -cualquier otra enfermedad que puede debilitar el sistema inmunolgico -transplante de mdula sea o rin -enfermedad renal -una reaccin alrgica o inusual al valaciclovir, aciclovir, ganciclovir, valganciclovir, a otros medicamentos, alimentos, colorantes o conservantes -si est embarazada o buscando quedar embarazada -si est amamantando a un beb Cmo debo utilizar este medicamento? Tome este medicamento por va oral con un vaso de agua. Siga las instrucciones de la etiqueta del [place]medicamento. Este medicamento se puede tomar con o sin alimentos. Tome sus dosis a intervalos regulares. No tome su medicamento con una frecuencia mayor a la indicada. Complete todo el tratamiento con el medicamento segn lo haya recetado su mdico o su profesional de la salud aun si considera que su problema ha mejorado. No deje de tomarlo excepto si as lo indica su mdico o su profesional de Beazer Homesla salud. Hable con su pediatra para informarse acerca del uso de este medicamento en nios. Aunque este medicamento ha sido recetado a nios tan menores como de 2 aos de edad para condiciones selectivas, las precauciones se aplican. Sobredosis: Pngase en contacto inmediatamente con un centro toxicolgico o una sala de urgencia si usted cree que  haya tomado demasiado medicamento. ATENCIN: Reynolds AmericanEste medicamento es solo para usted. No comparta este medicamento con nadie. Qu sucede si me olvido de una dosis? Si olvida una dosis, tmela lo antes posible. Si es casi la hora de la prxima dosis, tome slo esa dosis. No tome dosis adicionales o dobles. Qu puede interactuar con este medicamento? -cimetidina -probenecid Puede ser que esta lista no menciona todas las posibles interacciones. Informe a su profesional de Beazer Homesla salud de [employer]ctodos los productos a base de hierbas, medicamentos de Gidd[place]venta libre o suplementos nutritivos que est tomando. Si usted fuma, consume bebidas alcohlicas o si utiliza drogas ilegales, indqueselo tambin a su profesional de Beazer Homesla salud. Algunas sustancias pueden interactuar con su medicamento. A qu debo estar atento al usar PPL Corporationeste medicamento? Si los sntomas no mejoran despus de 1 semana, informe a su mdico o a su profesional de Beazer Homesla salud. Este medicamento es ms eficaz cuando se lo empieza a tomar en cuanto comienza la infeccin, durante las primeras 72 horas. Comience el tratamiento tan pronto como le sea posible despus de observar los primeros signos de infeccin como, hormigueo, picazn o dolor en la zona afectada. Es posible transmitir el herpes genital aun cuando no tiene sntomas. Siempre sigue prcticas de sexo seguro como usar preservativos de ltex o poliuretano cuando tiene contacto sexual. Debe mantenerse bien hidrato mientras est tomando este medicamento. Beba lquido en abundancia. Qu efectos secundarios puedo tener al Boston Scientificutilizar este medicamento? Efectos secundarios que debe informar a su mdico o a Producer, television/film/videosu profesional de la salud tan pronto como sea posible: -Therapist, artreacciones alrgicas como erupcin cutnea, picazn o urticarias, hinchazn de la cara, labios o lengua -comportamiento agresivo -confusin -alucinaciones -problemas de coordinacin, del habla, al caminar -dolor estomacal -temblor -dificultad para orinar  o  cambios en el volumen de orina Efectos secundarios que, por lo general, no requieren atencin mdica (debe informarlos a su mdico o a Producer, television/film/videosu profesional de la salud si persisten o si son molestos): -mareos -dolor de cabeza -nuseas, vmito Puede ser que esta lista no menciona todos los posibles efectos secundarios. Comunquese a su mdico por asesoramiento mdico Hewlett-Packardsobre los efectos secundarios. Usted puede informar los efectos secundarios a la FDA por telfono al 1-800-FDA-1088. Dnde debo guardar mi medicina? Mantngala fuera del alcance de los nios. Gurdela a Sanmina-SCItemperatura ambiente, entre 15 y 25 grados C (5959 y 6777 grados F). Mantenga el envase bien cerrado. Deseche todo el medicamento que no haya utilizado, despus de la fecha de vencimiento. ATENCIN: Este folleto es un resumen. Puede ser que no cubra toda la posible informacin. Si usted tiene preguntas acerca de esta medicina, consulte con su mdico, su farmacutico o su profesional de Radiographer, therapeuticla salud.  2017 Elsevier/Gold Standard (2014-06-28 00:00:00)     IF you received an x-ray today, you will receive an invoice from [facility]. Please contact [facility] at [phone] with questions or concerns regarding your invoice.   IF you received labwork today, you will receive an invoice from [facility]. Please contact [facility] at [phone] with questions or concerns regarding your invoice.   Our billing staff will not be able to assist you with questions regarding bills from these companies.  You will be contacted with the lab results as soon as they are available. The fastest way to get your results is to activate your My Chart account. Instructions are located on the last page of this paperwork. If you have not heard from us regarding the results in 2 weeks, please contact this office.

## 2016-05-16 NOTE — Progress Notes (Signed)
    MRN: [id number] DOB: 08/25/1970  Subjective:   Misty Choi is a 45 y.o. female presenting for chief complaint of Facial Pain (RIGHT)  Reports 1 day history of left sided facial rash and pain. She has had similar symptoms in the past. I treated her for Shingles empirically 06/2015. Also reports left sided neck lymph node pain. Denies eye pain, eye discharge, ear pain, ear discharge, cough, chest pain, n/v, abdominal pain. She has had 2 crowns placed, last f/u with her dentist was in November 2017, had reassuring findings.  Misty Choi has a current medication list which includes the following prescription(s): acyclovir, tramadol, and valacyclovir. Also is allergic to azithromycin and nitrofurantoin monohyd macro.  Misty Choi  has a past medical history of Allergy. Also  has a past surgical history that includes Eye surgery and Cesarean section.  Objective:   Vitals: BP 118/76 (BP Location: Right Arm, Patient Position: Sitting, Cuff Size: Large)   Pulse (!) 110   Temp 98.5 F (36.9 C) (Oral)   Resp 18   Ht 5' 3.78" (1.62 m)   Wt 199 lb (90.3 kg)   LMP 05/02/2016   SpO2 100%   BMI 34.39 kg/m   Physical Exam  Constitutional: She is oriented to person, place, and time. She appears well-developed and well-nourished.  HENT:  TM's intact bilaterally, no effusions or erythema. Nasal turbinates pink and moist, nasal passages patent. No sinus tenderness. Oropharynx clear, mucous membranes moist, dentition in good repair.  Eyes: Conjunctivae are normal. Pupils are equal, round, and reactive to light. Right eye exhibits no discharge. Left eye exhibits no discharge.  Neck: Normal range of motion. Neck supple.  Cardiovascular: Normal rate, regular rhythm and intact distal pulses.  Exam reveals no gallop and no friction rub.   No murmur heard. Pulmonary/Chest: No respiratory distress. She has no wheezes. She has no rales.  Lymphadenopathy:    She has no cervical adenopathy.  Neurological: She  is alert and oriented to person, place, and time.  Skin: Skin is warm and dry.   Results for orders placed or performed in visit on 05/16/16 (from the past 24 hour(s))  POCT CBC     Status: Abnormal   Collection Time: 05/16/16  3:15 PM  Result Value Ref Range   WBC 10.7 (A) 4.6 - 10.2 K/uL   Lymph, poc 2.3 0.6 - 3.4   POC LYMPH PERCENT 21.4 10 - 50 %L   MID (cbc) 0.5 0 - 0.9   POC MID % 5.1 0 - 12 %M   POC Granulocyte 7.9 (A) 2 - 6.9   Granulocyte percent 73.5 37 - 80 %G   RBC 4.35 4.04 - 5.48 M/uL   Hemoglobin 13.9 12.2 - 16.2 g/dL   HCT, POC 40.939.0 81.137.7 - 47.9 %   MCV 89.5 80 - 97 fL   MCH, POC 31.9 (A) 27 - 31.2 pg   MCHC 35.7 (A) 31.8 - 35.4 g/dL   RDW, POC 91.413.2 %   Platelet Count, POC 308 142 - 424 K/uL   MPV 8.1 0 - 99.8 fL   Assessment and Plan :   1. Facial rash 2. Facial pain 3. Facial erythema - Start Valtrex to cover for shingles. Labs pending. Use hydrocodone for moderate to severe pain. Recheck in 2 days if no improvement.  Wallis BambergMario Elisabetta Mishra, PA-C [facility] [phone] 05/16/2016 2:46 PM

## 2016-05-17 ENCOUNTER — Ambulatory Visit (INDEPENDENT_AMBULATORY_CARE_PROVIDER_SITE_OTHER): Payer: 59 | Admitting: Family Medicine

## 2016-05-17 VITALS — BP 132/90 | HR 89 | Temp 98.5°F | Resp 18 | Ht 63.78 in | Wt 195.0 lb

## 2016-05-17 DIAGNOSIS — B0222 Postherpetic trigeminal neuralgia: Secondary | ICD-10-CM | POA: Diagnosis not present

## 2016-05-17 DIAGNOSIS — R519 Headache, unspecified: Secondary | ICD-10-CM

## 2016-05-17 DIAGNOSIS — R51 Headache: Secondary | ICD-10-CM

## 2016-05-17 LAB — COMPREHENSIVE METABOLIC PANEL
A/G RATIO: 1.6 (ref 1.2–2.2)
ALBUMIN: 4.4 g/dL (ref 3.5–5.5)
ALK PHOS: 75 IU/L (ref 39–117)
ALT: 24 IU/L (ref 0–32)
AST: 19 IU/L (ref 0–40)
BILIRUBIN TOTAL: 0.3 mg/dL (ref 0.0–1.2)
BUN / CREAT RATIO: 15 (ref 9–23)
BUN: 10 mg/dL (ref 6–24)
CHLORIDE: 103 mmol/L (ref 96–106)
CO2: 21 mmol/L (ref 18–29)
Calcium: 9.3 mg/dL (ref 8.7–10.2)
Creatinine, Ser: 0.67 mg/dL (ref 0.57–1.00)
GFR calc non Af Amer: 106 mL/min/{1.73_m2} (ref >59)
GFR, EST AFRICAN AMERICAN: 122 mL/min/{1.73_m2} (ref >59)
GLOBULIN, TOTAL: 2.8 g/dL (ref 1.5–4.5)
GLUCOSE: 96 mg/dL (ref 65–99)
POTASSIUM: 4 mmol/L (ref 3.5–5.2)
SODIUM: 140 mmol/L (ref 134–144)
Total Protein: 7.2 g/dL (ref 6.0–8.5)

## 2016-05-17 LAB — ANA+ENA+DNA/DS+ANTICH+CENTR
ANA TITER 1: NEGATIVE
ENA RNP Ab: 0.3 AI (ref 0.0–0.9)
ENA SSA (RO) Ab: <0.2 AI (ref 0.0–0.9)
Scleroderma SCL-70: <0.2 AI (ref 0.0–0.9)
dsDNA Ab: <1 IU/mL (ref 0–9)

## 2016-05-17 LAB — SEDIMENTATION RATE: SED RATE: 10 mm/h (ref 0–32)

## 2016-05-17 LAB — HIV ANTIBODY (ROUTINE TESTING W REFLEX): HIV SCREEN 4TH GENERATION: NONREACTIVE

## 2016-05-17 LAB — C-REACTIVE PROTEIN: CRP: 2.5 mg/L (ref 0.0–4.9)

## 2016-05-17 MED ORDER — BACLOFEN 10 MG PO TABS: 10.0000 mg | ORAL_TABLET | Freq: Four times a day (QID) | ORAL | 0 refills | Status: DC

## 2016-05-17 MED ORDER — GABAPENTIN 300 MG PO CAPS: 300.0000 mg | ORAL_CAPSULE | Freq: Every day | ORAL | 1 refills | Status: DC

## 2016-05-17 MED ORDER — HYDROCODONE-ACETAMINOPHEN 5-325 MG PO TABS: 2.0000 | ORAL_TABLET | ORAL | 0 refills | Status: DC | PRN

## 2016-05-17 NOTE — Patient Instructions (Addendum)
Start taking half a tab of a baclofen 3 times a day. If you can tolerate it wean up to 1 tab 3 times a day and then up to 1 tab 4 times a day. This is most likely to cause some drowsiness and if you are on it for a long time can cause some withdrawal symptoms. However this has been shown to be more effective for the nerve pain then many other treatments. You can try the gabapentin before bed at night however I am unsure how effective this will be for trying geminal neuralgia though it tends to be quite effective for post herpetic pain so it is worth a trial. If you're having side effects of a.m. sedation this should gradually go away over the next several days to week area and this medicine is often gradually increased to 3-4 times a day if people can tolerate it.  Many times people are started on low doses of daily seizure medicine such as carbamazepine to prevent recurrence of this pain. This is often very well tolerated but I would recommend a visit with our neurologist and consideration of an MRI of the irritated nerve to ensure we are not overlooking anything.    IF you received an x-ray today, you will receive an invoice from [facility]. Please contact [facility] at [phone] with questions or concerns regarding your invoice.   IF you received labwork today, you will receive an invoice from [facility]. Please contact [facility] at [phone] with questions or concerns regarding your invoice.   Our billing staff will not be able to assist you with questions regarding bills from these companies.  You will be contacted with the lab results as soon as they are available. The fastest way to get your results is to activate your My Chart account. Instructions are located on the last page of this paperwork. If you have not heard from us regarding the results in 2 weeks, please contact this office.     Trigeminal Neuralgia Trigeminal neuralgia is a nerve disorder that causes  attacks of severe facial pain. The attacks last from a few seconds to several minutes. They can happen for days, weeks, or months and then go away for months or years. Trigeminal neuralgia is also called tic douloureux. What are the causes? This condition is caused by damage to a nerve in the face that is called the trigeminal nerve. An attack can be triggered by:  Talking.  Chewing.  Putting on makeup.  Washing your face.  Shaving your face.  Brushing your teeth.  Touching your face. What increases the risk? This condition is more likely to develop in:  Women.  People who are 46 years of age or older. What are the signs or symptoms? The main symptom of this condition is pain in the jaw, lips, eyes, nose, scalp, forehead, and face. The pain may be intense, stabbing, electric, or shock-like. How is this diagnosed? This condition is diagnosed with a physical exam. A CT scan or MRI may be done to rule out other conditions that can cause facial pain. How is this treated? This condition may be treated with:  Avoiding the things that trigger your attacks.  Pain medicine.  Surgery. This may be done in severe cases if other medical treatment does not provide relief. Follow these instructions at home:  Take over-the-counter and prescription medicines only as told by your health care provider.  If you wish to get pregnant, talk with your health care provider before  you start trying to get pregnant.  Avoid the things that trigger your attacks. It may help to:  Chew on the unaffected side of your mouth.  Avoid touching your face.  Avoid blasts of hot or cold air. Contact a health care provider if:  Your pain medicine is not helping.  You develop new, unexplained symptoms, such as:  Double vision.  Facial weakness.  Changes in hearing or balance.  You become pregnant. Get help right away if:  Your pain is unbearable, and your pain medicine does not help. This  information is not intended to replace advice given to you by your health care provider. Make sure you discuss any questions you have with your health care provider. Document Released: 05/03/2000 Document Revised: 01/07/2016 Document Reviewed: 08/29/2014 Elsevier Interactive Patient Education  2017 [employer]

## 2016-05-17 NOTE — Progress Notes (Addendum)
By signing my name below, I, Misty Choi, attest that this documentation has been prepared under the direction and in the presence of Misty SorensonEva Shaw, MD.  Electronically Signed: Arvilla MarketMesha Choi, Medical Scribe. 05/17/16. 4:39 PM.  Subjective:    Patient ID: Misty Choi, female    DOB: 02/14/1971, 45 y.o.   MRN: [id number]  HPI Chief Complaint  Patient presents with  . Follow-up    facial pain spreading to neck    HPI Comments: Misty Choi is a 45 y.o. female who presents to the [facility] for facial pain follow-up. She was seen yesterday for 1 day hx of left sided facial rash and pain with left sided cervical adenopathy and pain. She had similar sxs of Feb 2017 when she was presumptively treated for shingles with valtrex as the pain was similar to the blistering facial rash she developed in Feb.  Describes it as a pulsing piercing left temple pain around the 3rd branch of the trigeminal nerve as well as left tonsillar anterior cervical adenopathy. Yesterday she had constant burning on the left side of her chin that is now intermittent. She took ibuprofen, acetaminophen, and valtrex for her sxs. Her pain can be triggered by cold water over her hands. In the summer she had suspected dental pain, but the dentist couldn't find anything wrong when they checked. During the summer she took 3 ibuprofen with relief to her sxs but it no longer works for her. She has been experiencing some nausea, but suspects its due to not eating anything. Denies HA, changes in her vision, and PMHx of thyroid problems.   FHx: mom has thyroid issue  There are no active problems to display for this patient.  Past Medical History:  Diagnosis Date  . Allergy    Past Surgical History:  Procedure Laterality Date  . CESAREAN SECTION    . EYE SURGERY     Allergies  Allergen Reactions  . Azithromycin Rash  . Nitrofurantoin Monohyd Macro Rash   Prior to Admission medications     Medication Sig Start Date End Date Taking? Authorizing Provider  HYDROcodone-acetaminophen (NORCO) 5-325 MG tablet Take 1 tablet by mouth every 6 (six) hours as needed. 05/16/16  Yes Wallis BambergMario Mani, PA-C  valACYclovir (VALTREX) 1000 MG tablet Take 1 tablet (1,000 mg total) by mouth 3 (three) times daily. 05/16/16  Yes Wallis BambergMario Mani, PA-C  traMADol (ULTRAM) 50 MG tablet Take 1 tablet (50 mg total) by mouth every 8 (eight) hours as needed. Patient not taking: Reported on 05/17/2016 07/18/15   Wallis BambergMario Mani, PA-C   Social History   Social History  . Marital status: Single    Spouse name: N/A  . Number of children: N/A  . Years of education: N/A   Occupational History  . Not on file.   Social History Main Topics  . Smoking status: Never Smoker  . Smokeless tobacco: Never Used  . Alcohol use Yes  . Drug use: No  . Sexual activity: Yes    Birth control/ protection: IUD, Condom   Other Topics Concern  . Not on file   Social History Narrative  . No narrative on file   Review of Systems  Constitutional: Positive for fatigue. Negative for activity change, appetite change, chills and fever.  Eyes: Negative for visual disturbance.  Gastrointestinal: Positive for nausea. Negative for abdominal pain and vomiting.  Musculoskeletal: Positive for myalgias. Negative for arthralgias, gait problem, joint swelling, neck pain and neck  stiffness.  Skin: Negative for color change and rash.  Neurological: Negative for dizziness, tremors, seizures, syncope, facial asymmetry, speech difficulty, weakness, light-headedness, numbness and headaches.  Psychiatric/Behavioral: Positive for sleep disturbance.   Objective:  Physical Exam  Constitutional: She appears well-developed and well-nourished. No distress.  HENT:  Head: Normocephalic and atraumatic.  Mouth/Throat: Oropharynx is clear and moist.  Not tender over temporal artery  Eyes: Conjunctivae are normal.  Neck: Neck supple. No thyroid mass and no  thyromegaly present.  Cardiovascular: Normal rate.   Pulmonary/Chest: Effort normal.  Lymphadenopathy:       Head (right side): Submandibular and tonsillar (mild) adenopathy present. No preauricular and no posterior auricular adenopathy present.       Head (left side): Submandibular and tonsillar (mild) adenopathy present. No preauricular and no posterior auricular adenopathy present.    She has cervical adenopathy (left anterior andenopathy).       Right cervical: No posterior cervical adenopathy present.      Left cervical: No posterior cervical adenopathy present.       Right: Supraclavicular adenopathy present.       Left: Supraclavicular adenopathy present.  Pain at the submandibular space  Neurological: She is alert.  Skin: Skin is warm and dry.  Psychiatric: She has a normal mood and affect. Her behavior is normal.  Nursing note and vitals reviewed.  BP 132/90   Pulse 89   Temp 98.5 F (36.9 C) (Oral)   Resp 18   Ht 5' 3.78" (1.62 m)   Wt 195 lb (88.5 kg)   LMP 05/02/2016   SpO2 99%   BMI 33.70 kg/m  Assessment & Plan:   1. Trigeminal neuralgia, postherpetic   2. Facial pain   Pt had an episode of shingles of the left trigeminal nerve - mainly the 3rd branch - 10 mos prior.  Since then she has had several episodes of severe stabbing pain unresponsive to antivirals and unaccompanied by any rash, no paresthesia or weakness. Is asymptomatic between episodes. Suspect post-herpectic TMN but will refer to neuro for further eval - question if she needs MRI. Try baclofen.  Ok to stop antiviral.  Could try gabapentin qhs. May want to consider carbamazepaine. Does get a little relief with 2 hydrocodone so changed sig in the EMR so it does not appear that she has used > than prescribed but did NOT give pt a new rx.   Orders Placed This Encounter  Procedures  . Ambulatory referral to Neurology    Referral Priority:   Routine    Referral Type:   Consultation    Referral Reason:    Specialty Services Required    Requested Specialty:   Neurology    Number of Visits Requested:   1    Meds ordered this encounter  Medications  . HYDROcodone-acetaminophen (NORCO) 5-325 MG tablet    Sig: Take 2 tablets by mouth every 4 (four) hours as needed.    Dispense:  20 tablet    Refill:  0  . baclofen (LIORESAL) 10 MG tablet    Sig: Take 1 tablet (10 mg total) by mouth 4 (four) times daily. For trigeminal neuralgia pain    Dispense:  120 each    Refill:  0  . gabapentin (NEURONTIN) 300 MG capsule    Sig: Take 1 capsule (300 mg total) by mouth at bedtime.    Dispense:  30 capsule    Refill:  1    I personally performed the services described in this  documentation, which was scribed in my presence. The recorded information has been reviewed and considered, and addended by me as needed.   Misty Choi, M.D.  [facility]  [place] [address] [place], [place] [address] [phone] phone [phone]) [phone] fax  05/20/16 6:18 PM

## 2016-05-21 ENCOUNTER — Encounter: Payer: Self-pay | Admitting: Urgent Care

## 2016-05-24 LAB — SPECIMEN STATUS REPORT

## 2016-05-24 LAB — TSH: TSH: 1.5 u[IU]/mL (ref 0.450–4.500)

## 2016-11-11 ENCOUNTER — Ambulatory Visit (INDEPENDENT_AMBULATORY_CARE_PROVIDER_SITE_OTHER): Payer: 59 | Admitting: Family Medicine

## 2016-11-11 ENCOUNTER — Encounter: Payer: Self-pay | Admitting: Family Medicine

## 2016-11-11 VITALS — BP 136/88 | HR 89 | Temp 98.8°F | Resp 18 | Ht 63.78 in | Wt 192.8 lb

## 2016-11-11 DIAGNOSIS — Z1231 Encounter for screening mammogram for malignant neoplasm of breast: Secondary | ICD-10-CM

## 2016-11-11 DIAGNOSIS — Z130 Encounter for screening for diseases of the blood and blood-forming organs and certain disorders involving the immune mechanism: Secondary | ICD-10-CM | POA: Diagnosis not present

## 2016-11-11 DIAGNOSIS — Z113 Encounter for screening for infections with a predominantly sexual mode of transmission: Secondary | ICD-10-CM

## 2016-11-11 DIAGNOSIS — Z1383 Encounter for screening for respiratory disorder NEC: Secondary | ICD-10-CM

## 2016-11-11 DIAGNOSIS — Z23 Encounter for immunization: Secondary | ICD-10-CM

## 2016-11-11 DIAGNOSIS — Z136 Encounter for screening for cardiovascular disorders: Secondary | ICD-10-CM

## 2016-11-11 DIAGNOSIS — Z1329 Encounter for screening for other suspected endocrine disorder: Secondary | ICD-10-CM

## 2016-11-11 DIAGNOSIS — Z124 Encounter for screening for malignant neoplasm of cervix: Secondary | ICD-10-CM | POA: Diagnosis not present

## 2016-11-11 DIAGNOSIS — R030 Elevated blood-pressure reading, without diagnosis of hypertension: Secondary | ICD-10-CM | POA: Diagnosis not present

## 2016-11-11 DIAGNOSIS — R42 Dizziness and giddiness: Secondary | ICD-10-CM | POA: Diagnosis not present

## 2016-11-11 DIAGNOSIS — Z0000 Encounter for general adult medical examination without abnormal findings: Secondary | ICD-10-CM | POA: Diagnosis not present

## 2016-11-11 DIAGNOSIS — Z1389 Encounter for screening for other disorder: Secondary | ICD-10-CM | POA: Diagnosis not present

## 2016-11-11 LAB — POCT URINALYSIS DIP (MANUAL ENTRY)
BILIRUBIN UA: NEGATIVE
BILIRUBIN UA: NEGATIVE mg/dL
Glucose, UA: NEGATIVE mg/dL
Leukocytes, UA: NEGATIVE
Nitrite, UA: NEGATIVE
PH UA: 5.5 (ref 5.0–8.0)
PROTEIN UA: NEGATIVE mg/dL
Urobilinogen, UA: 0.2 E.U./dL

## 2016-11-11 MED ORDER — CYCLOBENZAPRINE HCL 5 MG PO TABS: 5.0000 mg | ORAL_TABLET | Freq: Three times a day (TID) | ORAL | 1 refills | Status: DC | PRN

## 2016-11-11 MED ORDER — METHOCARBAMOL 500 MG PO TABS: 500.0000 mg | ORAL_TABLET | Freq: Four times a day (QID) | ORAL | 1 refills | Status: DC | PRN

## 2016-11-11 NOTE — Progress Notes (Addendum)
Subjective:    Patient ID: Misty Choi, female    DOB: 03/26/1971, 46 y.o.   MRN: [id number] Chief Complaint  Patient presents with  . Annual Exam    PAP smear    HPI Misty Choi is a 46 year old woman who is here for her complete physical. Spanish is her native language but speaks English well and no need for interpreter. She has not had a prior complete physical in our system.  App in phone checks BP and after episode of lightheadness was several days 170s and then when she feels odd will be BP 140s.  When she turns her head to the right or stretching to the right will get lightheaded. Ears pop and start ringing and presyncopal.  Takes <1 mi to resolve. Now with left. If persist -> nausea. BP usually stays around 117-120. WIth palpitations. DOes have ah/o anxiety attacks.   No vision change other than needing reading glasses.  No headaches. No change in hearing but does have a chronic tinnitus.   Left trigeminal neuralgia ended up being piece of tooth left instade.   140S/90S ON 6/13 for sev d and felt poorly with cold sweats.   ATE 6:30 YOGURT with cereal  Parents dev heart disease/CVDs in 50-60s and overweights. Husband getting PhD in chemistry.   Uses condoms for birth control with fairly good consistency. Periods regular every 26-27 days. Last menstrual period June 10.  Has a PHD in chemistry. [place]oved [place] the [place] ago after 10 mos [place]. 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  Loosing medical insurance in 5d - will resume in Nov - 5 mos.  Past Medical History:  Diagnosis Date  . Allergy    Past Surgical History:  Procedure Laterality Date  . CESAREAN SECTION    . EYE SURGERY     No current outpatient prescriptions on file prior to visit.   No current facility-administered medications on file prior to visit.    Allergies  Allergen Reactions  . Azithromycin Rash  . Nitrofurantoin Monohyd Macro Rash   Family History  Problem  Relation Age of Onset  . Diabetes Mother   . Heart disease Mother   . Hyperlipidemia Mother   . Hypertension Mother   . Diabetes Father   . Heart disease Father   . Hyperlipidemia Father   . Hypertension Father   . Stroke Father   . Diabetes Maternal Grandmother   . Heart disease Maternal Grandmother   . Stroke Maternal Grandmother   . Diabetes Paternal Grandmother   . Hypertension Paternal Grandmother    Social History   Social History  . Marital status: Single    Spouse name: N/A  . Number of children: N/A  . Years of education: N/A   Social History Main Topics  . Smoking status: Never Smoker  . Smokeless tobacco: Never Used  . Alcohol use Yes  . Drug use: No  . Sexual activity: Yes    Birth control/ protection: IUD, Condom   Other Topics Concern  . None   Social History Narrative  . None   Depression screen [facility] 2/9 11/11/2016 05/17/2016 07/18/2015  Decreased Interest 0 0 0  Down, Depressed, Hopeless 0 0 0  PHQ - 2 Score 0 0 0     Review of Systems  HENT: Positive for tinnitus.   Eyes: Positive for visual disturbance.  Cardiovascular: Positive for palpitations.  Musculoskeletal: Positive for back pain, myalgias and neck  pain.  Allergic/Immunologic: Positive for environmental allergies.  Neurological: Positive for dizziness and light-headedness.  Psychiatric/Behavioral: The patient is nervous/anxious.        Objective:   Physical Exam  Constitutional: She is oriented to person, place, and time. She appears well-developed and well-nourished. No distress.  HENT:  Head: Normocephalic and atraumatic.  Right Ear: Tympanic membrane, external ear and ear canal normal.  Left Ear: Tympanic membrane, external ear and ear canal normal.  Nose: Mucosal edema present. No rhinorrhea.  Mouth/Throat: Uvula is midline, oropharynx is clear and moist and mucous membranes are normal. No posterior oropharyngeal erythema.  Eyes: Conjunctivae and EOM are normal. Pupils are  equal, round, and reactive to light. Right eye exhibits no discharge. Left eye exhibits no discharge. No scleral icterus.  Neck: Normal range of motion. Neck supple. No thyromegaly present.  Cardiovascular: Normal rate, regular rhythm, normal heart sounds and intact distal pulses.   Pulmonary/Chest: Effort normal and breath sounds normal. No respiratory distress.  Abdominal: Soft. Bowel sounds are normal. There is no tenderness.  Genitourinary: Uterus normal. No breast swelling, tenderness, discharge or bleeding. Cervix exhibits motion tenderness. Cervix exhibits no friability. Right adnexum displays no mass and no tenderness. Left adnexum displays no mass and no tenderness. Vaginal discharge found.  Musculoskeletal: She exhibits no edema.  Lymphadenopathy:    She has no cervical adenopathy.  Neurological: She is alert and oriented to person, place, and time. She has normal reflexes.  Skin: Skin is warm and dry. She is not diaphoretic. No erythema.  Psychiatric: She has a normal mood and affect. Her behavior is normal.      BP 136/88   Pulse 89   Temp 98.8 F (37.1 C) (Oral)   Resp 18   Ht 5' 3.78" (1.62 m)   Wt 192 lb 12.8 oz (87.5 kg)   LMP 10/27/2016 (Exact Date)   SpO2 100%   BMI 33.32 kg/m   EKG: NSR. No sig abnml or acute ischemic change. No prior EKG avail for comparison. Independently evaluated by myself and agree w/ computer interpretation.  Orthostatic VS normal - does have drop of BP and increase in HR but not sig enough change to qualify. Lying 116/74 HR 83 Sitting 121/81 HR 84 Standing at 0 min 110/75 HR 100 Assessment & Plan:   1. Annual physical exam   2. Routine screening for STI (sexually transmitted infection)   3. Encounter for screening mammogram for breast cancer   4. Screening for cardiovascular, respiratory, and genitourinary diseases   5. Screening for cervical cancer   6. Screening for deficiency anemia   7. Screening for thyroid disorder   8.  Episodic lightheadedness   9. Elevated blood pressure, situational     Orders Placed This Encounter  Procedures  . MM Digital Screening    Standing Status:   Future    Standing Expiration Date:   01/11/2018    Order Specific Question:   Reason for Exam (SYMPTOM  OR DIAGNOSIS REQUIRED)    Answer:   screening    Order Specific Question:   Is the patient pregnant?    Answer:   No    Order Specific Question:   Preferred imaging location?    Answer:   [facility]  . Tdap vaccine greater than or equal to 7yo IM  . CBC  . Comprehensive metabolic panel    Order Specific Question:   Has the patient fasted?    Answer:   Yes  . TSH  .  Lipid panel    Order Specific Question:   Has the patient fasted?    Answer:   Yes  . Hemoglobin A1c  . Orthostatic vital signs  . Care order/instruction:    Scheduling Instructions:     Complete orders, AVS and go.  Marland Kitchen POCT urinalysis dipstick  . EKG 12-Lead    Meds ordered this encounter  Medications  . cyclobenzaprine (FLEXERIL) 5 MG tablet    Sig: Take 1 tablet (5 mg total) by mouth 3 (three) times daily as needed for muscle spasms.    Dispense:  30 tablet    Refill:  1  . methocarbamol (ROBAXIN) 500 MG tablet    Sig: Take 1-2 tablets (500-1,000 mg total) by mouth every 6 (six) hours as needed for muscle spasms.    Dispense:  40 tablet    Refill:  1     Norberto Sorenson, M.D.  Primary Care at [facility] [address] [place], [place] [address] [phone] phone [phone] fax  11/13/16 10:39 PM

## 2016-11-11 NOTE — Patient Instructions (Addendum)
Tried a muscle relaxer combined with heat. A muscle relaxer cyclobenzaprine or Flexeril tends to make people sleepy so you might only be able to take it at night if that is the case for you. If it makes you too sleepy to tolerate, then try the methocarbamol instead. I sent both to your pharmacy so that you can fill them now using your insurance.  We'll likely need to do further imaging of the arteries in the back of your neck. Sometimes this can be done with an ultrasound often it requires an MRA or magnetic resonance angiography. A less expensive way to get additional evaluation or testing may be to see a neurologist for their clinical opinion. It sounds as if when your cutting off or crimping the blood flow in one of your posterior cerebral arteries that you are causing the dizziness. For the interim I would recommend trying to avoid any triggers. Triggering you with massage or stretching r symptoms are not likely to help them resolve in the long run.  Certainly helping to control your blood pressure and cholesterol in the meantime is important as well and can help prevent this from worsening.  I do think it wouldn't hurt to have you take a low dose 81 mg aspirin daily.  Treatments for cerebral artery disease focus mostly on preventing stroke. Treatments can include: ?Lifestyle changes - People can reduce their risk of stroke by: .Quitting smoking if they smoke .Being active .Losing weight if they are overweight .Eating a diet low in fat and cholesterol and high in fruits, vegetables, and low-fat dairy foods ?Medicines - Different people need different medicines to reduce their chances of having a stroke. In general, the medicines that can help prevent strokes include: .Medicines to lower blood pressure .Medicines called statins, which lower cholesterol .Medicines to prevent blood clots, such as aspirin   IF you received an x-ray today, you will receive an invoice from [facility]. Please  contact [facility] at [phone] with questions or concerns regarding your invoice.   IF you received labwork today, you will receive an invoice from [facility]. Please contact [facility] at [phone] with questions or concerns regarding your invoice.   Our billing staff will not be able to assist you with questions regarding bills from these companies.  You will be contacted with the lab results as soon as they are available. The fastest way to get your results is to activate your My Chart account. Instructions are located on the last page of this paperwork. If you have not heard from [place] regarding the results in 2 weeks, please contact this office.      Acute Torticollis, Adult Torticollis is a condition in which the muscles of the neck tighten (contract) abnormally, causing the neck to twist and the head to move into an unnatural position. Torticollis that develops suddenly is called acute torticollis. People with acute torticollis may have trouble turning their head. The condition can be painful and may range from mild to severe. What are the causes? This condition may be caused by:  Sleeping in an awkward position (common).  Extending or twisting the neck muscles beyond their normal position.  An injury to the neck muscles.  An infection.  A tumor.  Certain medicines.  Long-lasting spasms of the neck muscles.  In some cases, the cause may not be known. What increases the risk? You are more likely to develop this condition if:  You have a condition associated with loose ligaments, such as Down syndrome.  You have a brain condition that affects vision, such as strabismus.  What are the signs or symptoms? The main symptom of this condition is tilting of the head to one side. Other symptoms include:  Pain in the neck.  Trouble turning the head from side to side or up and down.  How is this diagnosed? This condition may be diagnosed based on:  A physical  exam.  Your medical history.  Imaging tests, such as: ? An X-ray. ? An ultrasound. ? A CT scan. ? An MRI.  How is this treated? Treatment for this condition depends on what is causing the condition. Mild cases may go away without treatment. Treatment for more serious cases may include:  Medicines or shots to relax the muscles.  Other medicines, such as antibiotics to treat the underlying cause.  Wearing a soft neck collar.  Physical therapy and stretching to improve neck strength and flexibility.  Neck massage.  In severe cases, surgery may be needed to repair dislocated or broken bones or to treat nerves in the neck. Follow these instructions at home:  Take over-the-counter and prescription medicines only as told by your health care provider.  Do stretching exercises and massage your neck as told by your health care provider.  If directed, apply heat to the affected area as often as told by your health care provider. Use the heat source that your health care provider recommends, such as a moist heat pack or a heating pad. ? Place a towel between your skin and the heat source. ? Leave the heat on for 20-30 minutes. ? Remove the heat if your skin turns bright red. This is especially important if you are unable to feel pain, heat, or cold. You may have a greater risk of getting burned.  If you wake up with torticollis after sleeping, check your bed or sleeping area. Look for lumpy pillows or unusual objects. Make sure your bed and sleeping area are comfortable.  Keep all follow-up visits as told by your health care provider. This is important. Contact a health care provider if:  You have a fever.  Your symptoms do not improve or they get worse. Get help right away if:  You have trouble breathing.  You develop noisy breathing (stridor).  You start to drool.  You have trouble swallowing or pain when swallowing.  You develop numbness or weakness in your hands or  feet.  You have changes in your speech, understanding, or vision.  You are in severe pain.  You cannot move your head or neck. Summary  Torticollis is a condition in which the muscles of the neck tighten (contract) abnormally, causing the neck to twist and the head to move into an unnatural position. Torticollis that develops suddenly is called acute torticollis.  Treatment for this condition depends on what is causing the condition. Mild cases may go away without treatment.  Do stretching exercises and massage your neck as told by your health care provider. You may also be instructed to apply heat to the area.  Contact your health care provider if your symptoms do not improve or they get worse. This information is not intended to replace advice given to you by your health care provider. Make sure you discuss any questions you have with your health care provider. Document Released: 05/03/2000 Document Revised: 07/04/2016 Document Reviewed: 07/04/2016 Elsevier Interactive Patient Education  2018 [place] Maintenance, Female Adopting a healthy lifestyle and getting preventive care can go a long way  to promote health and wellness. Talk with your health care provider about what schedule of regular examinations is right for you. This is a good chance for you to check in with your provider about disease prevention and staying healthy. In between checkups, there are plenty of things you can do on your own. Experts have done a lot of research about which lifestyle changes and preventive measures are most likely to keep you healthy. Ask your health care provider for more information. Weight and diet Eat a healthy diet  Be sure to include plenty of vegetables, fruits, low-fat dairy products, and lean protein.  Do not eat a lot of foods high in solid fats, added sugars, or salt.  Get regular exercise. This is one of the most important things you can do for your health. ? Most adults  should exercise for at least 150 minutes each week. The exercise should increase your heart rate and make you sweat (moderate-intensity exercise). ? Most adults should also do strengthening exercises at least twice a week. This is in addition to the moderate-intensity exercise.  Maintain a healthy weight  Body mass index (BMI) is a measurement that can be used to identify possible weight problems. It estimates body fat based on height and weight. Your health care provider can help determine your BMI and help you achieve or maintain a healthy weight.  For females 63 years of age and older: ? A BMI below 18.5 is considered underweight. ? A BMI of 18.5 to 24.9 is normal. ? A BMI of 25 to 29.9 is considered overweight. ? A BMI of 30 and above is considered obese.  Watch levels of cholesterol and blood lipids  You should start having your blood tested for lipids and cholesterol at 46 years of age, then have this test every 5 years.  You may need to have your cholesterol levels checked more often if: ? Your lipid or cholesterol levels are high. ? You are older than 47 years of age. ? You are at high risk for heart disease.  Cancer screening Lung Cancer  Lung cancer screening is recommended for adults 72-42 years old who are at high risk for lung cancer because of a history of smoking.  A yearly low-dose CT scan of the lungs is recommended for people who: ? Currently smoke. ? Have quit within the past 15 years. ? Have at least a 30-pack-year history of smoking. A pack year is smoking an average of one pack of cigarettes a day for 1 year.  Yearly screening should continue until it has been 15 years since you quit.  Yearly screening should stop if you develop a health problem that would prevent you from having lung cancer treatment.  Breast Cancer  Practice breast self-awareness. This means understanding how your breasts normally appear and feel.  It also means doing regular breast  self-exams. Let your health care provider know about any changes, no matter how small.  If you are in your 20s or 30s, you should have a clinical breast exam (CBE) by a health care provider every 1-3 years as part of a regular health exam.  If you are 49 or older, have a CBE every year. Also consider having a breast X-ray (mammogram) every year.  If you have a family history of breast cancer, talk to your health care provider about genetic screening.  If you are at high risk for breast cancer, talk to your health care provider about having an MRI and a  mammogram every year.  Breast cancer gene (BRCA) assessment is recommended for women who have family members with BRCA-related cancers. BRCA-related cancers include: ? Breast. ? Ovarian. ? Tubal. ? Peritoneal cancers.  Results of the assessment will determine the need for genetic counseling and BRCA1 and BRCA2 testing.  Cervical Cancer Your health care provider may recommend that you be screened regularly for cancer of the pelvic organs (ovaries, uterus, and vagina). This screening involves a pelvic examination, including checking for microscopic changes to the surface of your cervix (Pap test). You may be encouraged to have this screening done every 3 years, beginning at age 36.  For women ages 28-65, health care providers may recommend pelvic exams and Pap testing every 3 years, or they may recommend the Pap and pelvic exam, combined with testing for human papilloma virus (HPV), every 5 years. Some types of HPV increase your risk of cervical cancer. Testing for HPV may also be done on women of any age with unclear Pap test results.  Other health care providers may not recommend any screening for nonpregnant women who are considered low risk for pelvic cancer and who do not have symptoms. Ask your health care provider if a screening pelvic exam is right for you.  If you have had past treatment for cervical cancer or a condition that could  lead to cancer, you need Pap tests and screening for cancer for at least 20 years after your treatment. If Pap tests have been discontinued, your risk factors (such as having a new sexual partner) need to be reassessed to determine if screening should resume. Some women have medical problems that increase the chance of getting cervical cancer. In these cases, your health care provider may recommend more frequent screening and Pap tests.  Colorectal Cancer  This type of cancer can be detected and often prevented.  Routine colorectal cancer screening usually begins at 47 years of age and continues through 47 years of age.  Your health care provider may recommend screening at an earlier age if you have risk factors for colon cancer.  Your health care provider may also recommend using home test kits to check for hidden blood in the stool.  A small camera at the end of a tube can be used to examine your colon directly (sigmoidoscopy or colonoscopy). This is done to check for the earliest forms of colorectal cancer.  Routine screening usually begins at age 59.  Direct examination of the colon should be repeated every 5-10 years through 47 years of age. However, you may need to be screened more often if early forms of precancerous polyps or small growths are found.  Skin Cancer  Check your skin from head to toe regularly.  Tell your health care provider about any new moles or changes in moles, especially if there is a change in a mole's shape or color.  Also tell your health care provider if you have a mole that is larger than the size of a pencil eraser.  Always use sunscreen. Apply sunscreen liberally and repeatedly throughout the day.  Protect yourself by wearing long sleeves, pants, a wide-brimmed hat, and sunglasses whenever you are outside.  Heart disease, diabetes, and high blood pressure  High blood pressure causes heart disease and increases the risk of stroke. High blood pressure  is more likely to develop in: ? People who have blood pressure in the high end of the normal range (130-139/85-89 mm Hg). ? People who are overweight or obese. ? People  who are African American.  If you are 31-50 years of age, have your blood pressure checked every 3-5 years. If you are 28 years of age or older, have your blood pressure checked every year. You should have your blood pressure measured twice-once when you are at a hospital or clinic, and once when you are not at a hospital or clinic. Record the average of the two measurements. To check your blood pressure when you are not at a hospital or clinic, you can use: ? An automated blood pressure machine at a pharmacy. ? A home blood pressure monitor.  If you are between 68 years and 63 years old, ask your health care provider if you should take aspirin to prevent strokes.  Have regular diabetes screenings. This involves taking a blood sample to check your fasting blood sugar level. ? If you are at a normal weight and have a low risk for diabetes, have this test once every three years after 47 years of age. ? If you are overweight and have a high risk for diabetes, consider being tested at a younger age or more often. Preventing infection Hepatitis B  If you have a higher risk for hepatitis B, you should be screened for this virus. You are considered at high risk for hepatitis B if: ? You were born in a country where hepatitis B is common. Ask your health care provider which countries are considered high risk. ? Your parents were born in a high-risk country, and you have not been immunized against hepatitis B (hepatitis B vaccine). ? You have HIV or AIDS. ? You use needles to inject street drugs. ? You live with someone who has hepatitis B. ? You have had sex with someone who has hepatitis B. ? You get hemodialysis treatment. ? You take certain medicines for conditions, including cancer, organ transplantation, and autoimmune  conditions.  Hepatitis C  Blood testing is recommended for: ? Everyone born from [birth year] through 1966. ? Anyone with known risk factors for hepatitis C.  Sexually transmitted infections (STIs)  You should be screened for sexually transmitted infections (STIs) including gonorrhea and chlamydia if: ? You are sexually active and are younger than 47 years of age. ? You are older than 47 years of age and your health care provider tells you that you are at risk for this type of infection. ? Your sexual activity has changed since you were last screened and you are at an increased risk for chlamydia or gonorrhea. Ask your health care provider if you are at risk.  If you do not have HIV, but are at risk, it may be recommended that you take a prescription medicine daily to prevent HIV infection. This is called pre-exposure prophylaxis (PrEP). You are considered at risk if: ? You are sexually active and do not regularly use condoms or know the HIV status of your partner(s). ? You take drugs by injection. ? You are sexually active with a partner who has HIV.  Talk with your health care provider about whether you are at high risk of being infected with HIV. If you choose to begin PrEP, you should first be tested for HIV. You should then be tested every 3 months for as long as you are taking PrEP. Pregnancy  If you are premenopausal and you may become pregnant, ask your health care provider about preconception counseling.  If you may become pregnant, take 400 to 800 micrograms (mcg) of folic acid every day.  If you want  to prevent pregnancy, talk to your health care provider about birth control (contraception). Osteoporosis and menopause  Osteoporosis is a disease in which the bones lose minerals and strength with aging. This can result in serious bone fractures. Your risk for osteoporosis can be identified using a bone density scan.  If you are 58 years of age or older, or if you are at risk for  osteoporosis and fractures, ask your health care provider if you should be screened.  Ask your health care provider whether you should take a calcium or vitamin D supplement to lower your risk for osteoporosis.  Menopause may have certain physical symptoms and risks.  Hormone replacement therapy may reduce some of these symptoms and risks. Talk to your health care provider about whether hormone replacement therapy is right for you. Follow these instructions at home:  Schedule regular health, dental, and eye exams.  Stay current with your immunizations.  Do not use any tobacco products including cigarettes, chewing tobacco, or electronic cigarettes.  If you are pregnant, do not drink alcohol.  If you are breastfeeding, limit how much and how often you drink alcohol.  Limit alcohol intake to no more than 1 drink per day for nonpregnant women. One drink equals 12 ounces of beer, 5 ounces of wine, or 1 ounces of hard liquor.  Do not use street drugs.  Do not share needles.  Ask your health care provider for help if you need support or information about quitting drugs.  Tell your health care provider if you often feel depressed.  Tell your health care provider if you have ever been abused or do not feel safe at home. This information is not intended to replace advice given to you by your health care provider. Make sure you discuss any questions you have with your health care provider. Document Released: 11/19/2010 Document Revised: 10/12/2015 Document Reviewed: 02/07/2015 Elsevier Interactive Patient Education  [employer].

## 2016-11-12 LAB — LIPID PANEL
CHOL/HDL RATIO: 5.5 ratio — AB (ref 0.0–4.4)
Cholesterol, Total: 197 mg/dL (ref 100–199)
HDL: 36 mg/dL — AB (ref >39)
LDL CALC: 84 mg/dL (ref 0–99)
Triglycerides: 384 mg/dL — ABNORMAL HIGH (ref 0–149)
VLDL CHOLESTEROL CAL: 77 mg/dL — AB (ref 5–40)

## 2016-11-12 LAB — COMPREHENSIVE METABOLIC PANEL
ALBUMIN: 4.8 g/dL (ref 3.5–5.5)
ALT: 22 IU/L (ref 0–32)
AST: 24 IU/L (ref 0–40)
Albumin/Globulin Ratio: 1.7 (ref 1.2–2.2)
Alkaline Phosphatase: 72 IU/L (ref 39–117)
BUN / CREAT RATIO: 12 (ref 9–23)
BUN: 10 mg/dL (ref 6–24)
Bilirubin Total: 0.5 mg/dL (ref 0.0–1.2)
CALCIUM: 9.4 mg/dL (ref 8.7–10.2)
CO2: 21 mmol/L (ref 20–29)
CREATININE: 0.83 mg/dL (ref 0.57–1.00)
Chloride: 103 mmol/L (ref 96–106)
GFR, EST AFRICAN AMERICAN: 98 mL/min/{1.73_m2} (ref >59)
GFR, EST NON AFRICAN AMERICAN: 85 mL/min/{1.73_m2} (ref >59)
GLUCOSE: 89 mg/dL (ref 65–99)
Globulin, Total: 2.8 g/dL (ref 1.5–4.5)
Potassium: 4.2 mmol/L (ref 3.5–5.2)
Sodium: 139 mmol/L (ref 134–144)
TOTAL PROTEIN: 7.6 g/dL (ref 6.0–8.5)

## 2016-11-12 LAB — CBC
HEMATOCRIT: 41.6 % (ref 34.0–46.6)
HEMOGLOBIN: 14.1 g/dL (ref 11.1–15.9)
MCH: 30.7 pg (ref 26.6–33.0)
MCHC: 33.9 g/dL (ref 31.5–35.7)
MCV: 90 fL (ref 79–97)
Platelets: 349 10*3/uL (ref 150–379)
RBC: 4.6 x10E6/uL (ref 3.77–5.28)
RDW: 14.2 % (ref 12.3–15.4)
WBC: 10.5 10*3/uL (ref 3.4–10.8)

## 2016-11-12 LAB — TSH: TSH: 2.14 u[IU]/mL (ref 0.450–4.500)

## 2016-11-12 LAB — HEMOGLOBIN A1C
Est. average glucose Bld gHb Est-mCnc: 114 mg/dL
HEMOGLOBIN A1C: 5.6 % (ref 4.8–5.6)

## 2016-11-13 LAB — PAP IG AND HPV HIGH-RISK
HPV, high-risk: NEGATIVE
PAP Smear Comment: 0

## 2017-02-14 ENCOUNTER — Other Ambulatory Visit: Payer: Self-pay | Admitting: Family Medicine

## 2017-02-14 DIAGNOSIS — Z1231 Encounter for screening mammogram for malignant neoplasm of breast: Secondary | ICD-10-CM

## 2017-02-21 ENCOUNTER — Ambulatory Visit
Admission: RE | Admit: 2017-02-21 | Discharge: 2017-02-21 | Disposition: A | Discharge status: Home / Self Care | Payer: 59 | Source: Ambulatory Visit | Attending: Family Medicine | Admitting: Family Medicine

## 2017-02-21 DIAGNOSIS — Z1231 Encounter for screening mammogram for malignant neoplasm of breast: Secondary | ICD-10-CM | POA: Diagnosis not present

## 2017-02-25 ENCOUNTER — Other Ambulatory Visit: Payer: Self-pay | Admitting: Family Medicine

## 2017-02-25 DIAGNOSIS — R928 Other abnormal and inconclusive findings on diagnostic imaging of breast: Secondary | ICD-10-CM

## 2017-03-12 ENCOUNTER — Other Ambulatory Visit: Payer: 59

## 2017-03-12 ENCOUNTER — Ambulatory Visit: Payer: 59

## 2017-03-12 ENCOUNTER — Other Ambulatory Visit: Payer: Self-pay | Admitting: Family Medicine

## 2017-03-12 ENCOUNTER — Ambulatory Visit
Admission: RE | Admit: 2017-03-12 | Discharge: 2017-03-12 | Disposition: A | Discharge status: Home / Self Care | Payer: 59 | Source: Ambulatory Visit | Attending: Family Medicine | Admitting: Family Medicine

## 2017-03-12 DIAGNOSIS — R928 Other abnormal and inconclusive findings on diagnostic imaging of breast: Secondary | ICD-10-CM

## 2017-03-12 DIAGNOSIS — N6489 Other specified disorders of breast: Secondary | ICD-10-CM

## 2017-03-12 DIAGNOSIS — N651 Disproportion of reconstructed breast: Secondary | ICD-10-CM | POA: Diagnosis not present

## 2017-04-07 ENCOUNTER — Other Ambulatory Visit: Payer: Self-pay

## 2017-04-07 ENCOUNTER — Ambulatory Visit (INDEPENDENT_AMBULATORY_CARE_PROVIDER_SITE_OTHER): Payer: 59 | Admitting: Family Medicine

## 2017-04-07 ENCOUNTER — Encounter: Payer: Self-pay | Admitting: Family Medicine

## 2017-04-07 VITALS — BP 122/84 | HR 80 | Temp 98.1°F | Resp 16 | Ht 63.78 in | Wt 187.4 lb

## 2017-04-07 DIAGNOSIS — M62830 Muscle spasm of back: Secondary | ICD-10-CM

## 2017-04-07 MED ORDER — MELOXICAM 15 MG PO TABS: 15.0000 mg | ORAL_TABLET | Freq: Every day | ORAL | 4 refills | Status: DC

## 2017-04-07 MED ORDER — CYCLOBENZAPRINE HCL 5 MG PO TABS: 5.0000 mg | ORAL_TABLET | Freq: Three times a day (TID) | ORAL | 5 refills | Status: DC | PRN

## 2017-04-07 NOTE — Progress Notes (Signed)
Subjective:    Patient ID: Misty Choi, female    DOB: 07/21/1970, 46 y.o.   MRN: [id number] Chief Complaint  Patient presents with  . Follow-up    from 11/11/16 lightheadedness is better     HPI Episodic hypertension. Lightheadedness when turning her head by lateral flexion or lateral rotation. Initially started on the right side only but then started occurring on the left as well. Her ears pop, start ringing, and she becomes presyncopal briefly with palpitations - sxs resolve in < 1 min. Stared hap Typically when she has stress even her right arm will go numb and all sxs resolved. No weakness. When she over uses it it, it does get pins and needles and usually is accomanied by neck pain. Does occ wake her from sleep. Is exacerbated by typing/writing. Started during pregnancy. The medications helped a lot.  She got some tingling when she took the methocarbamol through her cheek but was able to tolerate the cyclobenzaprine.  She is taking ibuprofen 400mg  qam prn.   No BP elevation.   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  Has a PHD in chemistry.   Has mammogram scheduled for April ina  6 mo follow-up to left breast asymmetry suspected to be benign after diagnostic mammogram and US last mo.   ?Metabolic syndrome: Triglycerides on lipids 5 mos ago elevated at 384 with low HDL at 36 so advised increasing HDL though Mediterranean diet and exercise. LDL 84, non-HDL 161. a1c nml 5.6%.  No vitamins or supplements. Only other otc med is allergy.  Keeps forgetting asa 81 mg qd.  Past Medical History:  Diagnosis Date  . Allergy    Past Surgical History:  Procedure Laterality Date  . CESAREAN SECTION    . EYE SURGERY     No current outpatient medications on file prior to visit.   No current facility-administered medications on file prior to visit.    Allergies  Allergen Reactions  . Azithromycin Rash  . Nitrofurantoin Monohyd Macro Rash   Family History    Problem Relation Age of Onset  . Diabetes Mother   . Heart disease Mother   . Hyperlipidemia Mother   . Hypertension Mother   . Diabetes Father   . Heart disease Father   . Hyperlipidemia Father   . Hypertension Father   . Stroke Father   . Diabetes Maternal Grandmother   . Heart disease Maternal Grandmother   . Stroke Maternal Grandmother   . Diabetes Paternal Grandmother   . Hypertension Paternal Grandmother   . Breast cancer Neg Hx    Social History   Socioeconomic History  . Marital status: Single    Spouse name: None  . Number of children: None  . Years of education: None  . Highest education level: None  Social Needs  . Financial resource strain: None  . Food insecurity - worry: None  . Food insecurity - inability: None  . Transportation needs - medical: None  . Transportation needs - non-medical: None  Occupational History  . None  Tobacco Use  . Smoking status: Never Smoker  . Smokeless tobacco: Never Used  Substance and Sexual Activity  . Alcohol use: Yes  . Drug use: No  . Sexual activity: Yes    Birth control/protection: IUD, Condom  Other Topics Concern  . None  Social History Narrative  . None   [facility] 04/07/2017 11/11/2016 05/17/2016 07/18/2015  Decreased Interest 0 0  0 0  Down, Depressed, Hopeless 0 0 0 0  PHQ - 2 Score 0 0 0 0    Review of Systems See hpi    Objective:   Physical Exam  Constitutional: She is oriented to person, place, and time. She appears well-developed and well-nourished. No distress.  HENT:  Head: Normocephalic and atraumatic.  Right Ear: External ear normal.  Left Ear: External ear normal.  Eyes: Conjunctivae are normal. No scleral icterus.  Neck: Normal range of motion. Neck supple. No thyromegaly present.  Cardiovascular: Normal rate, regular rhythm, normal heart sounds and intact distal pulses.  Pulmonary/Chest: Effort normal and breath sounds normal. No respiratory distress.  Musculoskeletal:  She exhibits no edema.       Cervical back: She exhibits normal range of motion, no tenderness, no bony tenderness, no pain and no spasm.       Thoracic back: She exhibits pain and spasm. She exhibits normal range of motion, no tenderness and no bony tenderness.  Lymphadenopathy:    She has no cervical adenopathy.  Neurological: She is alert and oriented to person, place, and time. She has normal strength. She displays no atrophy. No cranial nerve deficit or sensory deficit. She exhibits normal muscle tone. Coordination and gait normal.  Reflex Scores:      Tricep reflexes are 2+ on the right side and 2+ on the left side.      Bicep reflexes are 2+ on the right side and 2+ on the left side.      Brachioradialis reflexes are 2+ on the right side and 2+ on the left side. Skin: Skin is warm and dry. She is not diaphoretic. No erythema.  Psychiatric: She has a normal mood and affect. Her behavior is normal.       BP 122/84   Pulse 80   Temp 98.1 F (36.7 C)   Resp 16   Ht 5' 3.78" (1.62 m)   Wt 187 lb 6.4 oz (85 kg)   SpO2 99%   BMI 32.39 kg/m   Assessment & Plan:  Flu shot - pt declines  1. Spasm of thoracic back muscle   heat, stretching, ergonomics.  Flairs w/ stress in which case responded well to the flexeril so ok to keep supply at home prn flairs - try adding in meloxicam qam as well during these.  Meds ordered this encounter  Medications  . cyclobenzaprine (FLEXERIL) 5 MG tablet    Sig: Take 1-2 tablets (5-10 mg total) 3 (three) times daily as needed by mouth for muscle spasms.    Dispense:  30 tablet    Refill:  5  . meloxicam (MOBIC) 15 MG tablet    Sig: Take 1 tablet (15 mg total) daily by mouth. As needed for neck/back pain.    Dispense:  30 tablet    Refill:  4    Norberto SorensonEva Shaw, M.D.  [facility]  [place] [address] [place], [place] [address] [phone] phone [phone] fax  04/10/17 8:27 AM

## 2017-04-07 NOTE — Patient Instructions (Addendum)
IF you received an x-ray today, you will receive an invoice from [facility]. Please contact [facility] at [phone] with questions or concerns regarding your invoice.   IF you received labwork today, you will receive an invoice from [facility]. Please contact [facility] at [phone] with questions or concerns regarding your invoice.   Our billing staff will not be able to assist you with questions regarding bills from these companies.  You will be contacted with the lab results as soon as they are available. The fastest way to get your results is to activate your My Chart account. Instructions are located on the last page of this paperwork. If you have not heard from us regarding the results in 2 weeks, please contact this office.     Back/Neck: How to set up your workstation  Key Points:  Chair:  An adjustable chair to fit your height with a slight downward tilt of chair seat is helpful.  Should have good high-back support that assists with keeping the natural curves of your spine.  A "lumbar roll" or pillow at your low back can help you keep good posture.  Adjustable arm rests may decrease strain in upper body.   Keyboard:  Should be close and at elbow or just below elbow height.  Split keyboards can help decrease strain on wrist.  Wrist supports can provide mini-breaks to your wrists throughout the day.  Monitor/Screen:  Top of screen should be at eye level or slightly lower.  Good lighting is important to prevent eye strain, or headaches from glare.  Head Posture: A copy holder on either side of the monitor will help limit neck strain.  Head should not be forward.  Ears should line up with you shoulders.  Leg Posture:  Knee and hips should be bent half-way (about a 90 degree angle).  Shorter people may need something to prop their feet on, with knees bent, like a phone book.  Other Stuff: Keep the stuff (phones, pens, phonebooks, etc) that you use  frequently close to you, so you're not straining to reach them.   Back Exercises The following exercises strengthen the muscles that help to support the back. They also help to keep the lower back flexible. Doing these exercises can help to prevent back pain or lessen existing pain. If you have back pain or discomfort, try doing these exercises 2-3 times each day or as told by your health care provider. When the pain goes away, do them once each day, but increase the number of times that you repeat the steps for each exercise (do more repetitions). If you do not have back pain or discomfort, do these exercises once each day or as told by your health care provider. Exercises Single Knee to Chest  Repeat these steps 3-5 times for each leg: 1. Lie on your back on a firm bed or the floor with your legs extended. 2. Bring one knee to your chest. Your other leg should stay extended and in contact with the floor. 3. Hold your knee in place by grabbing your knee or thigh. 4. Pull on your knee until you feel a gentle stretch in your lower back. 5. Hold the stretch for 10-30 seconds. 6. Slowly release and straighten your leg.  Pelvic Tilt  Repeat these steps 5-10 times: 1. Lie on your back on a firm bed or the floor with your legs extended. 2. Bend your knees so they are pointing toward the ceiling and your feet  are flat on the floor. 3. Tighten your lower abdominal muscles to press your lower back against the floor. This motion will tilt your pelvis so your tailbone points up toward the ceiling instead of pointing to your feet or the floor. 4. With gentle tension and even breathing, hold this position for 5-10 seconds.  Cat-Cow  Repeat these steps until your lower back becomes more flexible: 1. Get into a hands-and-knees position on a firm surface. Keep your hands under your shoulders, and keep your knees under your hips. You may place padding under your knees for comfort. 2. Let your head hang  down, and point your tailbone toward the floor so your lower back becomes rounded like the back of a cat. 3. Hold this position for 5 seconds. 4. Slowly lift your head and point your tailbone up toward the ceiling so your back forms a sagging arch like the back of a cow. 5. Hold this position for 5 seconds.  Press-Ups  Repeat these steps 5-10 times: 1. Lie on your abdomen (face-down) on the floor. 2. Place your palms near your head, about shoulder-width apart. 3. While you keep your back as relaxed as possible and keep your hips on the floor, slowly straighten your arms to raise the top half of your body and lift your shoulders. Do not use your back muscles to raise your upper torso. You may adjust the placement of your hands to make yourself more comfortable. 4. Hold this position for 5 seconds while you keep your back relaxed. 5. Slowly return to lying flat on the floor.  Bridges  Repeat these steps 10 times: 1. Lie on your back on a firm surface. 2. Bend your knees so they are pointing toward the ceiling and your feet are flat on the floor. 3. Tighten your buttocks muscles and lift your buttocks off of the floor until your waist is at almost the same height as your knees. You should feel the muscles working in your buttocks and the back of your thighs. If you do not feel these muscles, slide your feet 1-2 inches farther away from your buttocks. 4. Hold this position for 3-5 seconds. 5. Slowly lower your hips to the starting position, and allow your buttocks muscles to relax completely.  If this exercise is too easy, try doing it with your arms crossed over your chest. Abdominal Crunches  Repeat these steps 5-10 times: 1. Lie on your back on a firm bed or the floor with your legs extended. 2. Bend your knees so they are pointing toward the ceiling and your feet are flat on the floor. 3. Cross your arms over your chest. 4. Tip your chin slightly toward your chest without bending your  neck. 5. Tighten your abdominal muscles and slowly raise your trunk (torso) high enough to lift your shoulder blades a tiny bit off of the floor. Avoid raising your torso higher than that, because it can put too much stress on your low back and it does not help to strengthen your abdominal muscles. 6. Slowly return to your starting position.  Back Lifts Repeat these steps 5-10 times: 1. Lie on your abdomen (face-down) with your arms at your sides, and rest your forehead on the floor. 2. Tighten the muscles in your legs and your buttocks. 3. Slowly lift your chest off of the floor while you keep your hips pressed to the floor. Keep the back of your head in line with the curve in your back. Your eyes  should be looking at the floor. 4. Hold this position for 3-5 seconds. 5. Slowly return to your starting position.  Contact a health care provider if:  Your back pain or discomfort gets much worse when you do an exercise.  Your back pain or discomfort does not lessen within 2 hours after you exercise. If you have any of these problems, stop doing these exercises right away. Do not do them again unless your health care provider says that you can. Get help right away if:  You develop sudden, severe back pain. If this happens, stop doing the exercises right away. Do not do them again unless your health care provider says that you can. This information is not intended to replace advice given to you by your health care provider. Make sure you discuss any questions you have with your health care provider. Document Released: 06/13/2004 Document Revised: 09/13/2015 Document Reviewed: 06/30/2014 Elsevier Interactive Patient Education  2017 Elsevier Inc.  Metabolic Syndrome Metabolic syndrome is the presence of at least three factors that increase your risk of getting cardiovascular disease and diabetes. These factors are:  High blood sugar.  High blood triglyceride level.  High blood  pressure.  Low levels of good blood cholesterol (high-density lipoprotein or HDL).  Excess weight around the waist. This factor is present with a waist measurement of: ? More than 40 inches in men. ? More than 35 inches in women.  Metabolic syndrome is sometimes called insulin resistance syndrome and syndrome X. What are the causes? The exact cause is not known, but genetics and lifestyle choices play a role. What increases the risk? You are more likely to develop metabolic syndrome if:  You eat a diet high in calories and saturated fat.  You do not exercise regularly.  You are overweight.  You have a family history of metabolic syndrome.  You are Asian.  You are older in age.  You have insulin resistance.  You use any tobacco products, including cigarettes, chewing tobacco, or electronic cigarettes.  What are the signs or symptoms? Metabolic syndrome has no specific symptoms. How is this diagnosed? To make a diagnosis, your health care provider will determine whether you have at least three of the factors that make up metabolic syndrome by:  Taking your blood pressure.  Measuring your waist.  Ordering blood tests.  How is this treated? Treatment may include:  Lifestyle changes to reduce your risk for heart disease and stroke, such as: ? Exercise. ? Weight loss. ? Maintaining a healthy diet. ? Quitting the use of any tobacco products, including cigarettes, chewing tobacco, or electronic cigarettes.  Medicines that: ? Help your body to maintain glucose control. ? Reduce your blood pressure and your blood triglyceride levels.  Follow these instructions at home:  Exercise regularly.  Maintain a healthy diet.  Do not use any tobacco products, including cigarettes, chewing tobacco, or electronic cigarettes. If you need help quitting, ask your health care provider.  Keep all follow-up visits as directed by your health care provider. This is  important.  Measure your waist regularly and record the measurement. To measure your waist: ? Stand up straight. ? Breathe out. ? Wrap the measuring tape around the part of your waist that is just above your hipbones. ? Read the measurement. Contact a health care provider if:  You feel very tired.  You develop excessive thirst.  You pass large quantities of urine.  You put on weight around your waist.  You have headaches over and  over again.  You have a dizzy spell. Get help right away if:  You develop sudden blurred vision.  You develop a sudden dizzy spell.  You have sudden trouble speaking or swallowing.  You have sudden weakness in your arm or leg.  You have chest pains or trouble breathing.  You feel like your heartbeat is abnormal.  You faint. This information is not intended to replace advice given to you by your health care provider. Make sure you discuss any questions you have with your health care provider. Document Released: 08/13/2007 Document Revised: 10/12/2015 Document Reviewed: 12/10/2013 Elsevier Interactive Patient Education  2018 [employer] Diet for Metabolic Syndrome Metabolic syndrome is a disorder that includes at least three of these conditions:  Abdominal obesity.  Too much sugar in your blood.  High blood pressure.  Higher than normal amount of fat (lipids) in your blood.  Lower than normal level of "good" cholesterol (HDL).  Following a healthy diet can help to keep metabolic syndrome under control. It can also help to prevent the development of conditions that are associated with metabolic syndrome, such as diabetes, heart disease, and stroke. Along with exercise, a healthy diet:  Helps to improve the way that the body uses insulin.  Promotes weight loss. A common goal for people with this condition is to lose at least 7 to 10 percent of their starting weight.  What do I need to know about this diet?  Use the glycemic index  (GI) to plan your meals. The index tells you how quickly a food will raise your blood sugar. Choose foods that have low GI values. These foods take a longer time to raise blood sugar.  Keep track of how many calories you take in. Eating the right amount of calories will help your achieve a healthy weight.  You may want to follow a Mediterranean diet. This diet includes lots of vegetables, lean meats or fish, whole grains, fruits, and healthy oils and fats. What foods can I eat? Grains Stone-ground whole wheat. Pumpernickel bread. Whole-grain bread, crackers, tortillas, cereal, and pasta. Unsweetened oatmeal.Bulgur.Barley.Quinoa.Brown rice or wild rice. Vegetables Lettuce. Spinach. Peas. Beets. Cauliflower. Cabbage. Broccoli. Carrots. Tomatoes. Squash. Eggplant. Herbs. Peppers. Onions. Cucumbers. Brussels sprouts. Sweet potatoes. Yams. Beans. Lentils. Fruits Berries. Apples. Oranges. Grapes. Mango. Pomegranate. Kiwi. Cherries. Meats and Other Protein Sources Seafood and shellfish. Lean meats.Poultry. Tofu. Dairy Low-fat or fat-free dairy products, such as milk, yogurt, and cheese. Beverages Water. Low-fat milk. Milk alternatives, like soy milk or almond milk. Real fruit juice. Condiments Low-sugar or sugar-free ketchup, barbecue sauce, and mayonnaise. Mustard. Relish. Fats and Oils Avocado. Canola or olive oil. Nuts and nut butters.Seeds. The items listed above may not be a complete list of recommended foods or beverages. Contact your dietitian for more options. What foods are not recommended? Red meat. Palm oil and coconut oil. Processed foods. Fried foods. Alcohol. Sweetened drinks, such as iced tea and soda. Sweets. Salty foods. The items listed above may not be a complete list of foods and beverages to avoid. Contact your dietitian for more information. This information is not intended to replace advice given to you by your health care provider. Make sure you discuss any questions  you have with your health care provider. Document Released: 09/20/2014 Document Revised: 09/15/2015 Document Reviewed: 05/18/2014 Elsevier Interactive Patient Education  2018 [employer]

## 2017-07-08 ENCOUNTER — Ambulatory Visit (INDEPENDENT_AMBULATORY_CARE_PROVIDER_SITE_OTHER): Payer: 59 | Admitting: Physician Assistant

## 2017-07-08 ENCOUNTER — Encounter: Payer: Self-pay | Admitting: Physician Assistant

## 2017-07-08 VITALS — BP 124/84 | HR 105 | Temp 98.4°F | Resp 17 | Ht 65.0 in | Wt 185.0 lb

## 2017-07-08 DIAGNOSIS — L500 Allergic urticaria: Secondary | ICD-10-CM

## 2017-07-08 MED ORDER — FAMOTIDINE 20 MG PO TABS: 20.0000 mg | ORAL_TABLET | Freq: Two times a day (BID) | ORAL | 0 refills | Status: DC

## 2017-07-08 MED ORDER — METHYLPREDNISOLONE ACETATE 80 MG/ML IJ SUSP
80.0000 mg | Freq: Once | INTRAMUSCULAR | Status: AC
  Administered 2017-07-08: 80 mg via INTRAMUSCULAR

## 2017-07-08 MED ORDER — LEVOCETIRIZINE DIHYDROCHLORIDE 5 MG PO TABS: 5.0000 mg | ORAL_TABLET | Freq: Every evening | ORAL | 0 refills | Status: DC

## 2017-07-08 NOTE — Progress Notes (Signed)
    07/08/2017 3:28 PM   DOB: 05/13/1971 / MRN: [id number]  SUBJECTIVE:  Misty Choi is a 47 y.o. female presenting for itchy rash (see photos).  This started 4-day days ago.  She can make no associations with new detergents, foods.  She has had similar reactions in the past however not quite this bad.  Denies lip throat tongue swelling and shortness of breath.  She is allergic to azithromycin and nitrofurantoin monohyd macro.   She  has a past medical history of Allergy.    She  reports that  has never smoked. she has never used smokeless tobacco. She reports that she drinks alcohol. She reports that she does not use drugs. She  reports that she currently engages in sexual activity. She reports using the following methods of birth control/protection: IUD and Condom. The patient  has a past surgical history that includes Eye surgery and Cesarean section.  Her family history includes Diabetes in her father, maternal grandmother, mother, and paternal grandmother; Heart disease in her father, maternal grandmother, and mother; Hyperlipidemia in her father and mother; Hypertension in her father, mother, and paternal grandmother; Stroke in her father and maternal grandmother.  ROS   As per HP I otherwise negative.  The problem list and medications were reviewed and updated by myself where necessary and exist elsewhere in the encounter.   OBJECTIVE:  BP 124/84   Pulse (!) 105   Temp 98.4 F (36.9 C) (Oral)   Resp 17   Ht 5\' 5"  (1.651 m)   Wt 185 lb (83.9 kg)   LMP 06/17/2017 (Approximate)   SpO2 98%   BMI 30.79 kg/m    Pulse Readings from Last 3 Encounters:  07/08/17 (!) 105  04/07/17 80  11/11/16 89     Physical Exam  Cardiovascular: Regular rhythm, normal heart sounds and intact distal pulses.  Pulmonary/Chest: Effort normal and breath sounds normal.  Skin: Rash noted.             No results found for this or any previous visit (from the past 72  hour(s)).  No results found.  ASSESSMENT AND PLAN:  Misty Choi was seen today for rash.  Diagnoses and all orders for this visit:  Allergic urticaria: Blanching confluent erythematous rash with incessant itching.  Heart rate mildly elevated today above baseline.  We will check a TSH.  We will check an allergy panel.  Depo-Medrol she appears to be miserable.  H2 blocker and H1 blocker prescribed. -     TSH -     Allergens(96) Foods    The patient is advised to call or return to clinic if she does not see an improvement in symptoms, or to seek the care of the closest emergency department if she worsens with the above plan.   Deliah BostonMichael River Ambrosio, MHS, PA-C [facility] at [facility] 07/08/2017 3:28 PM

## 2017-07-08 NOTE — Patient Instructions (Signed)
     IF you received an x-ray today, you will receive an invoice from [facility]. Please contact [facility] at [phone] with questions or concerns regarding your invoice.   IF you received labwork today, you will receive an invoice from [facility]. Please contact [facility] at [phone] with questions or concerns regarding your invoice.   Our billing staff will not be able to assist you with questions regarding bills from these companies.  You will be contacted with the lab results as soon as they are available. The fastest way to get your results is to activate your My Chart account. Instructions are located on the last page of this paperwork. If you have not heard from us regarding the results in 2 weeks, please contact this office.     

## 2017-07-10 LAB — ALLERGENS(96) FOODS
Allergen Apple, IgE: <0.1 kU/L
Allergen Banana IgE: <0.1 kU/L
Allergen Barley IgE: <0.1 kU/L
Allergen Black Pepper IgE: <0.1 kU/L
Allergen Blueberry IgE: <0.1 kU/L
Allergen Broccoli: <0.1 kU/L
Allergen Cabbage IgE: <0.1 kU/L
Allergen Carrot IgE: <0.1 kU/L
Allergen Cauliflower IgE: <0.1 kU/L
Allergen Celery IgE: <0.1 kU/L
Allergen Cinnamon IgE: <0.1 kU/L
Allergen Coconut IgE: <0.1 kU/L
Allergen Corn, IgE: <0.1 kU/L
Allergen Cucumber IgE: <0.1 kU/L
Allergen Garlic IgE: <0.1 kU/L
Allergen Ginger IgE: <0.1 kU/L
Allergen Gluten IgE: <0.1 kU/L
Allergen Grape IgE: <0.1 kU/L
Allergen Grapefruit IgE: <0.1 kU/L
Allergen Green Bean IgE: <0.1 kU/L
Allergen Green Bell Pepper IgE: <0.1 kU/L
Allergen Green Pea IgE: <0.1 kU/L
Allergen Lamb IgE: <0.1 kU/L
Allergen Lettuce IgE: <0.1 kU/L
Allergen Lime IgE: <0.1 kU/L
Allergen Melon IgE: <0.1 kU/L
Allergen Oat IgE: <0.1 kU/L
Allergen Onion IgE: <0.1 kU/L
Allergen Pear IgE: <0.1 kU/L
Allergen Potato, White IgE: <0.1 kU/L
Allergen Rice IgE: <0.1 kU/L
Allergen Salmon IgE: <0.1 kU/L
Allergen Strawberry IgE: <0.1 kU/L
Allergen Sweet Potato IgE: <0.1 kU/L
Allergen Tomato, IgE: <0.1 kU/L
Allergen Turkey IgE: <0.1 kU/L
Allergen Watermelon IgE: <0.1 kU/L
Allergen, Peach f95: <0.1 kU/L
Basil: <0.1 kU/L
Beef IgE: <0.1 kU/L
C074-IgE Gelatin: <0.1 kU/L
Chicken IgE: <0.1 kU/L
Chocolate/Cacao IgE: <0.1 kU/L
Clam IgE: <0.1 kU/L
Codfish IgE: <0.1 kU/L
Coffee: <0.1 kU/L
Cranberry IgE: <0.1 kU/L
Egg White IgE: <0.1 kU/L
F020-IgE Almond: <0.1 kU/L
F023-IgE Crab: <0.1 kU/L
F045-IgE Yeast: <0.1 kU/L
F076-IgE Alpha Lactalbumin: <0.1 kU/L
F077-IgE Beta Lactoglobulin: <0.1 kU/L
F078-IgE Casein: <0.1 kU/L
F080-IgE Lobster: <0.1 kU/L
F081-IgE Cheese, Cheddar Type: <0.1 kU/L
F089-IgE Mustard: <0.1 kU/L
F096-IgE Avocado: <0.1 kU/L
F202-IgE Cashew Nut: <0.1 kU/L
F214-IgE Spinach: <0.1 kU/L
F222-IgE Tea: <0.1 kU/L
F242-IgE Bing Cherry: <0.1 kU/L
F247-IgE Honey: <0.1 kU/L
F261-IgE Asparagus: <0.1 kU/L
F262-IgE Eggplant: <0.1 kU/L
F265-IgE Cumin: <0.1 kU/L
F278-IgE Bayleaf (Laurel): <0.1 kU/L
F279-IgE Chili Pepper: <0.1 kU/L
F283-IgE Oregano: <0.1 kU/L
F300-IgE Goat's Milk: <0.1 kU/L
F342-IgE Olive, Black: <0.1 kU/L
F343-IgE Raspberry: <0.1 kU/L
Hops: <0.1 kU/L
IgE Egg (Yolk): <0.1 kU/L
Kidney Bean IgE: <0.1 kU/L
Lemon: <0.1 kU/L
Lima Bean IgE: <0.1 kU/L
Malt: <0.1 kU/L
Mushroom IgE: <0.1 kU/L
Orange: <0.1 kU/L
Peanut IgE: <0.1 kU/L
Pineapple IgE: <0.1 kU/L
Pork IgE: <0.1 kU/L
Pumpkin IgE: 0.1 kU/L — AB
Red Beet: <0.1 kU/L
Rye IgE: <0.1 kU/L
Scallop IgE: <0.1 kU/L
Sesame Seed IgE: <0.1 kU/L
Shrimp IgE: 0.28 kU/L — AB
Soybean IgE: <0.1 kU/L
Tuna: <0.1 kU/L
Vanilla: <0.1 kU/L
Walnut IgE: <0.1 kU/L
Wheat IgE: <0.1 kU/L
Whey: <0.1 kU/L
White Bean IgE: <0.1 kU/L

## 2017-07-10 LAB — TSH: TSH: 2.54 u[IU]/mL (ref 0.450–4.500)

## 2017-08-12 ENCOUNTER — Ambulatory Visit (INDEPENDENT_AMBULATORY_CARE_PROVIDER_SITE_OTHER): Payer: 59

## 2017-08-12 ENCOUNTER — Ambulatory Visit: Payer: Self-pay | Admitting: *Deleted

## 2017-08-12 ENCOUNTER — Encounter: Payer: Self-pay | Admitting: Urgent Care

## 2017-08-12 ENCOUNTER — Other Ambulatory Visit: Payer: Self-pay

## 2017-08-12 ENCOUNTER — Ambulatory Visit (INDEPENDENT_AMBULATORY_CARE_PROVIDER_SITE_OTHER): Payer: 59 | Admitting: Urgent Care

## 2017-08-12 VITALS — BP 135/86 | HR 94 | Temp 98.5°F | Resp 17 | Ht 65.0 in | Wt 182.8 lb

## 2017-08-12 DIAGNOSIS — R200 Anesthesia of skin: Secondary | ICD-10-CM

## 2017-08-12 DIAGNOSIS — R202 Paresthesia of skin: Secondary | ICD-10-CM | POA: Diagnosis not present

## 2017-08-12 DIAGNOSIS — R55 Syncope and collapse: Secondary | ICD-10-CM

## 2017-08-12 DIAGNOSIS — M542 Cervicalgia: Secondary | ICD-10-CM

## 2017-08-12 DIAGNOSIS — Z8619 Personal history of other infectious and parasitic diseases: Secondary | ICD-10-CM | POA: Diagnosis not present

## 2017-08-12 DIAGNOSIS — M791 Myalgia, unspecified site: Secondary | ICD-10-CM

## 2017-08-12 DIAGNOSIS — R5381 Other malaise: Secondary | ICD-10-CM

## 2017-08-12 DIAGNOSIS — M47812 Spondylosis without myelopathy or radiculopathy, cervical region: Secondary | ICD-10-CM | POA: Diagnosis not present

## 2017-08-12 LAB — POCT CBC
GRANULOCYTE PERCENT: 73.9 % (ref 37–80)
HEMATOCRIT: 39.2 % (ref 37.7–47.9)
HEMOGLOBIN: 13.3 g/dL (ref 12.2–16.2)
Lymph, poc: 1.9 (ref 0.6–3.4)
MCH, POC: 31.1 pg (ref 27–31.2)
MCHC: 34.1 g/dL (ref 31.8–35.4)
MCV: 91.2 fL (ref 80–97)
MID (cbc): 0.4 (ref 0–0.9)
MPV: 7.7 fL (ref 0–99.8)
PLATELET COUNT, POC: 399 10*3/uL (ref 142–424)
POC GRANULOCYTE: 6.7 (ref 2–6.9)
POC LYMPH PERCENT: 21.1 %L (ref 10–50)
POC MID %: 5 %M (ref 0–12)
RBC: 4.3 M/uL (ref 4.04–5.48)
RDW, POC: 13.6 %
WBC: 9 10*3/uL (ref 4.6–10.2)

## 2017-08-12 LAB — POCT URINALYSIS DIP (MANUAL ENTRY)
BILIRUBIN UA: NEGATIVE mg/dL
Bilirubin, UA: NEGATIVE
Glucose, UA: NEGATIVE mg/dL
Leukocytes, UA: NEGATIVE
Nitrite, UA: NEGATIVE
PH UA: 5.5 (ref 5.0–8.0)
PROTEIN UA: NEGATIVE mg/dL
SPEC GRAV UA: 1.015 (ref 1.010–1.025)
Urobilinogen, UA: 0.2 E.U./dL

## 2017-08-12 MED ORDER — GABAPENTIN 300 MG PO CAPS: 300.0000 mg | ORAL_CAPSULE | Freq: Three times a day (TID) | ORAL | 3 refills | Status: DC
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
## 2017-08-12 NOTE — Patient Instructions (Addendum)
Neuralgia postherptica (Postherpetic Neuralgia) La neuralgia postherptica (NPH) es un dolor neural que aparece despus de tener culebrilla. La culebrilla es una erupcin cutnea dolorosa que aparece en un lado del cuerpo, generalmente en el tronco o el rostro. Es causada por el virus de la varicela zoster, el mismo virus que causa la varicela. En las personas que tuvieron varicela, el virus puede reaparecer aos ms tarde y causar culebrilla. Puede tener neuralgia postherptica si sigue teniendo dolor durante 3meses despus de la desaparicin de la erupcin de la culebrilla. La neuralgia postherptica aparece en la misma zona donde se produjo la erupcin cutnea de la culebrilla, y, en la mayora de los casos, desaparece en el trmino de 1ao. La aplicacin de la vacuna contra la culebrilla puede prevenir la neuralgia postherptica. Se recomienda que las personas mayores de 50aos se apliquen esta vacuna, la cual puede prevenir la culebrilla y, adems, reducir el riesgo de neuralgia postherptica si tiene culebrilla. CAUSAS La neuralgia postherptica se debe al dao neural que causa el virus de la varicela zoster. Este dao hipersensibiliza los nervios. FACTORES DE RIESGO El envejecimiento es el principal factor de riesgo de desarrollar neuralgia postherptica. La mayora de las personas que padecen este trastorno son mayores de 60aos. Otros factores de riesgo son:  Sentir un dolor muy intenso antes de que aparezca la erupcin cutnea de la culebrilla.  Tener una erupcin cutnea muy extensa.  Tener culebrilla en el nervio que inerva el rostro y el ojo (nervio trigmino). SIGNOS Y SNTOMAS El dolor es el sntoma principal de neuralgia postherptica. Suele ser muy intenso y puede describirse como un dolor punzante, que provoca sensacin de quemazn o que se parece a una descarga elctrica. El dolor puede ser intermitente o permanente. Los roces suaves sobre la piel o los cambios de temperatura  pueden desencadenar el dolor. Junto con el dolor, puede tener picazn. DIAGNSTICO El mdico puede diagnosticar la neuralgia postherptica en funcin de los sntomas y de los antecedentes de culebrilla. Generalmente, no es necesario realizar anlisis de laboratorio ni otras pruebas diagnsticas. TRATAMIENTO No hay cura para la neuralgia postherptica. El tratamiento se centrar en el alivio del dolor. Generalmente, los analgsicos de venta libre no alivian el dolor que provoca la neuralgia postherptica. Tal vez deba consultar a un especialista en dolor. El tratamiento puede incluir:  Antidepresivos para ayudar con el dolor y mejorar el sueo.  Anticonvulsivos para aliviar el dolor neural.  Analgsicos fuertes (opioides).  Un parche anestsico que se coloca sobre la piel (parche de lidocana). INSTRUCCIONES PARA EL CUIDADO EN EL HOGAR Recuperarse de la neuralgia postherptica puede llevar mucho tiempo. Trabaje en estrecha colaboracin con el mdico y procure tener un buen sistema de apoyo en su casa.  Tome todos los medicamentos como se lo haya indicado el mdico.  Use ropa cmoda y suelta.  Cubra las zonas sensibles con una venda para reducir la friccin de las prendas contra la zona.  Si el fro no empeora el dolor, intente aplicarse una compresa fra o una bolsa con gel para bajar la temperatura en la zona.  Hable con el mdico si est deprimido o desesperado. Convivir con un dolor crnico puede causar depresin. SOLICITE ATENCIN MDICA SI:  El medicamento no le calma el dolor.  Tiene dificultades para controlar el dolor en su casa. Esta informacin no tiene como fin reemplazar el consejo del mdico. Asegrese de hacerle al mdico cualquier pregunta que tenga. Document Released: 08/22/2008 Document Revised: 05/27/2014 Document Reviewed: 04/27/2013 Elsevier Interactive Patient   Education  2018 [employer]vier [employer].      Gabapentin capsules or tablets Qu es este medicamento? La  GABAPENTINA se utiliza para controlar las crisis parciales en adultos con epilepsia. Este medicamento tambin se [place] Islandsutiliza para tratar ciertos tipos de dolores crnicos relacionados con los nervios. Este medicamento puede ser utilizado para otros usos; si tiene alguna pregunta consulte con su proveedor de atencin mdica o con su farmacutico. MARCAS COMUNES: Active-PAC with Gabapentin, Gabarone, Neurontin Qu le debo informar a mi profesional de la salud antes de tomar este medicamento? Necesita saber si usted presenta alguno de los Coventry Health Caresiguientes problemas o situaciones: -enfermedad renal -ideas suicidas, planes o si usted o alguien de su familia ha intentado un suicidio previo -una reaccin alrgica o inusual a la gabapentina, otros medicamentos, alimentos, colorantes o conservadores -si est embarazada o buscando quedar embarazada -si est amamantando a un beb Cmo debo utilizar este medicamento? Tome este medicamento por va oral con un vaso de agua. Siga las instrucciones de la etiqueta del Vernon Centermedicamento. Puede tomarlo con o sin alimentos. Si le genera malestar estomacal, tmelo con alimentos. Tome su medicamento a intervalos regulares. No lo tome con una frecuencia mayor a la indicada. No deje de tomarlo, excepto si as lo indica su mdico. Si le indican romper las tabletas de 600 u 800 mg por la mitad como parte de su dosis, la media tableta extra debe usarse para la prxima dosis. Si no ha usado la media tableta extra Ciscodentro de los [address] das, debe desecharla. Su farmacutico le dar una Gua del medicamento especial (MedGuide, nombre en ingls) con cada receta y en cada ocasin que la vuelva a surtir. Asegrese de leer esta informacin cada vez cuidadosamente. Hable con su pediatra para informarse acerca del uso de este medicamento en nios. Puede requerir atencin especial. Sobredosis: Pngase en contacto inmediatamente con un centro toxicolgico o una sala de urgencia si usted cree que haya tomado  demasiado medicamento. ATENCIN: Reynolds AmericanEste medicamento es solo para usted. No comparta este medicamento con nadie. Qu sucede si me olvido de una dosis? Si olvida una dosis, tmela lo antes posible. Si es casi la hora de la prxima dosis, tome slo esa dosis. No tome dosis adicionales o dobles. Qu puede interactuar con este medicamento? No tome esta medicina con ninguno de los siguientes medicamentos: -otros productos de gabapentina Esta medicina tambin puede interactuar con los siguientes medicamentos: -alcohol -anticidos -antihistamnicos para Environmental consultantalergias, tos y resfros -ciertos medicamentos para la ansiedad o para dormir -ciertos medicamentos para la depresin o trastornos psicticos -homatropina; hidrocodona -naproxeno -medicamentos narcticos(opiceos)para el dolor -fenotiazinas, tales como clorpromacina, mesoridazina, proclorperazina, tioridazina Puede ser que esta lista no menciona todas las posibles interacciones. Informe a su profesional de Beazer Homesla salud de [employer] Inctodos los productos a base de hierbas, medicamentos de [place]venta libre o suplementos nutritivos que est tomando. Si usted fuma, consume bebidas alcohlicas o si utiliza drogas ilegales, indqueselo tambin a su profesional de Beazer Homesla salud. Algunas sustancias pueden interactuar con su medicamento. A qu debo estar atento al usar PPL Corporationeste medicamento? Visite a su mdico o a su profesional de la salud para chequear su evolucin peridicamente. Tal vez desee mantener un registro personal en su hogar de cmo siente que su enfermedad responde al tratamiento. Puede compartir esta informacin con su mdico o con su profesional de la salud durante cada Byramconsulta. Debe comunicarse con su mdico o con su profesional de la salud si sus convulsiones empeoran o si experimenta un nuevo tipo de convulsiones. No deje de tomar Goodrich Corporationeste  medicamento ni ninguno de sus medicamentos para las convulsiones a menos que as lo indique su mdico o su profesional de Radiographer, therapeutic. Si  suspende repentinamente su medicamento, el nmero de convulsiones o su gravedad puede aumentar. Use una pulsera o cadena de identificacin mdica si toma este medicamento para tratar sus convulsiones. Lleve consigo una tarjeta de identificacin que indique todos sus medicamentos. Puede experimentar somnolencia, mareos o visin borrosa. No conduzca ni utilice maquinaria, ni haga nada que Scientist, research (life sciences) en estado de alerta hasta que sepa cmo le afecta este medicamento. Para reducir los Kellogg, no se siente ni se ponga de pie con rapidez, especialmente si es un paciente de edad avanzada. El alcohol puede aumentar la somnolencia y [employer]. Evite consumir bebidas alcohlicas. Se le podr secar la boca. Masticar chicle si azcar, chupar caramelos duros y beber agua en abundancia le ayudar a mantener la boca hmeda. El uso de este medicamento puede aumentar la posibilidad de Wilburt Finlay ideas o comportamiento suicida. Presta atencin a como usted responde al medicamento mientras est usndolo. Informe a su profesional de la salud inmediatamente de cualquier empeoramiento de humor o ideas de suicidio o de morir. Las mujeres que se encuentran Database administrator usan este medicamento pueden inscribirse en el registro del [employer] American Antiepileptic Drug Pregnancy Registry (Registro estadounidense de Psychiatrist de Medicamentos Antiepilpticos) llamando al telfono [phone]. Este registro recoge informacin acerca de la seguridad del uso de medicamentos antiepilpticos durante el Psychiatrist. Qu efectos secundarios puedo tener al [employer] Scientific este medicamento? Efectos secundarios que debe informar a su mdico o a Producer, television/film/video de la salud tan pronto como sea posible: -Therapist, art como erupcin cutnea, picazn o urticarias, hinchazn de la cara, labios o lengua -empeoramiento de humor o ideas o actos de suicidio o de morir Efectos secundarios que, por lo general, no requieren atencin  mdica (debe informarlos a su mdico o a su profesional de la salud si persisten o si son molestos): -estreimiento -dificultad para caminar o controlar los movimientos musculares -mareos -nuseas -habla arrastrando las palabras -cansancio -temblores -aumento de peso Puede ser que esta lista no menciona todos los posibles efectos secundarios. Comunquese a su mdico por asesoramiento mdico Hewlett[employer]ard. Usted puede informar los efectos secundarios a la FDA por telfono al 1-800-FDA-1088. Dnde debo guardar mi medicina? Mantenga fuera del alcance de los nios. Este medicamento puede causar Neomia Dear sobredosis accidental y la muerte si lo toman otros adultos, nios o Neurosurgeon. Mezcle todo el medicamento sin usar con una sustancia como piedras sanitarias para gatos o posos (residuos) de caf. Luego deseche el [employer] Applications International un recipiente cerrado, como una bolsa sellada o una lata de caf con tapa. No utilice este medicamento despus de la fecha de vencimiento. Guarde a Sanmina-SCI, entre 15 y 30 grados Celsius (59 y 88 grados Fahrenheit). ATENCIN: Este folleto es un resumen. Puede ser que no cubra toda la posible informacin. Si usted tiene preguntas acerca de esta medicina, consulte con su mdico, su farmacutico o su profesional de Radiographer, therapeutic.  2018 Elsevier/Gold Standard (2016-06-06 00:00:00)     IF you received an x-ray today, you will receive an invoice from [facility]. Please contact [facility] at [phone] with questions or concerns regarding your invoice.   IF you received labwork today, you will receive an invoice from [facility]. Please contact [facility] at [phone] with questions or concerns regarding your invoice.   Our billing staff will not be able to assist you with questions  regarding bills from these companies.  You will be contacted with the lab results as soon as they are available. The fastest way to get your results is to  activate your My Chart account. Instructions are located on the last page of this paperwork. If you have not heard from [place] regarding the results in 2 weeks, please contact this office.
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
## 2017-08-13 LAB — SEDIMENTATION RATE: Sed Rate: 10 mm/hr (ref 0–32)

## 2017-08-13 LAB — URINALYSIS, MICROSCOPIC ONLY: Casts: NONE SEEN /lpf

## 2017-08-13 LAB — COMPREHENSIVE METABOLIC PANEL
A/G RATIO: 1.7 (ref 1.2–2.2)
ALT: 16 IU/L (ref 0–32)
AST: 20 IU/L (ref 0–40)
Albumin: 4.8 g/dL (ref 3.5–5.5)
Alkaline Phosphatase: 81 IU/L (ref 39–117)
BILIRUBIN TOTAL: 0.4 mg/dL (ref 0.0–1.2)
BUN/Creatinine Ratio: 12 (ref 9–23)
BUN: 9 mg/dL (ref 6–24)
CALCIUM: 9.4 mg/dL (ref 8.7–10.2)
CHLORIDE: 104 mmol/L (ref 96–106)
CO2: 23 mmol/L (ref 20–29)
Creatinine, Ser: 0.73 mg/dL (ref 0.57–1.00)
GFR, EST AFRICAN AMERICAN: 113 mL/min/{1.73_m2} (ref >59)
GFR, EST NON AFRICAN AMERICAN: 98 mL/min/{1.73_m2} (ref >59)
GLOBULIN, TOTAL: 2.8 g/dL (ref 1.5–4.5)
Glucose: 104 mg/dL — ABNORMAL HIGH (ref 65–99)
POTASSIUM: 4 mmol/L (ref 3.5–5.2)
SODIUM: 142 mmol/L (ref 134–144)
Total Protein: 7.6 g/dL (ref 6.0–8.5)

## 2017-08-13 LAB — THYROID PANEL
Free Thyroxine Index: 1.9 (ref 1.2–4.9)
T3 UPTAKE RATIO: 24 % (ref 24–39)
T4, Total: 7.9 ug/dL (ref 4.5–12.0)

## 2017-08-13 LAB — CK: Total CK: 152 U/L (ref 24–173)

## 2017-08-18 ENCOUNTER — Encounter: Payer: Self-pay | Admitting: Urgent Care

## 2017-08-26 ENCOUNTER — Ambulatory Visit (INDEPENDENT_AMBULATORY_CARE_PROVIDER_SITE_OTHER): Payer: 59 | Admitting: Urgent Care

## 2017-08-26 ENCOUNTER — Encounter: Payer: Self-pay | Admitting: Urgent Care

## 2017-08-26 VITALS — BP 132/80 | HR 92 | Temp 98.0°F | Resp 16 | Ht 65.0 in | Wt 183.6 lb

## 2017-08-26 DIAGNOSIS — R202 Paresthesia of skin: Secondary | ICD-10-CM | POA: Diagnosis not present

## 2017-08-26 DIAGNOSIS — F419 Anxiety disorder, unspecified: Secondary | ICD-10-CM

## 2017-08-26 DIAGNOSIS — R200 Anesthesia of skin: Secondary | ICD-10-CM | POA: Diagnosis not present

## 2017-08-26 DIAGNOSIS — M542 Cervicalgia: Secondary | ICD-10-CM

## 2017-08-26 DIAGNOSIS — Z8619 Personal history of other infectious and parasitic diseases: Secondary | ICD-10-CM | POA: Diagnosis not present

## 2017-08-26 MED ORDER — FLUOXETINE HCL 20 MG PO TABS: 20.0000 mg | ORAL_TABLET | Freq: Every day | ORAL | 1 refills | Status: DC

## 2017-08-26 MED ORDER — ALPRAZOLAM 0.25 MG PO TABS
0.2500 mg | ORAL_TABLET | Freq: Two times a day (BID) | ORAL | 0 refills | Status: DC | PRN
Start: 2017-08-26 — End: 2017-09-18

## 2017-08-26 NOTE — Progress Notes (Signed)
    MRN: [id number] DOB: 03/05/1971  Subjective:   Misty Choi is a 47 y.o. female presenting for follow up on tingling of her left face. Admits improvement with her facial tingling with 1 capsule of gabapentin daily. Also admits resolution of her neck pain with 1 dose of meloxicam. She has had a harder time with her mood and anxiety. Her husband does not have a job currently, has been very stressful for her. 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 Has a history of panic attacks. Has felt more overwhelmed recently but denies panic attacks. Denies SI, HI. Denies smoking cigarettes.  Misty Choi has a current medication list which includes the following prescription(s): gabapentin. Also is allergic to azithromycin and nitrofurantoin monohyd macro.  Misty Choi  has a past medical history of Allergy. Also  has a past surgical history that includes Eye surgery and Cesarean section.  Objective:   Vitals: BP 132/80   Pulse 92   Temp 98 F (36.7 C) (Oral)   Resp 16   Ht 5\' 5"  (1.651 m)   Wt 183 lb 9.6 oz (83.3 kg)   LMP 08/07/2017   SpO2 99%   BMI 30.55 kg/m   Physical Exam  Constitutional: She is oriented to person, place, and time. She appears well-developed and well-nourished.  Cardiovascular: Normal rate.  Pulmonary/Chest: Effort normal.  Neurological: She is alert and oriented to person, place, and time.  Psychiatric:  Anxious demeanor. Patient cannot stay still in her chair, fidgets a lot.    Assessment and Plan :   Anxiety  History of herpes zoster  Numbness and tingling of left side of face  Neck discomfort  Will start patient on Prozac. Use Xanax prn. Counseled on benefits of behavioral therapy. Reviewed labs with patient as she was requesting more work up to make sure everything is okay. Return-to-clinic precautions discussed, patient verbalized understanding. Will follow up with patient in 6 weeks bymychart.  Wallis BambergMario Rylin Saez, PA-C [facility] [phone] 08/26/2017 3:18 PM

## 2017-08-26 NOTE — Patient Instructions (Addendum)
[facility] [place], [place] [id number]  Burnard Leigh [phone]  Horton Finer [phone]  Everardo Beals [phone]  [facility] ([address] [place]) - [phone]  [facility]) - [phone]  [facility] - [phone]  [facility] [place] - [phone]  [facility]  - [phone] (do not file insurance)      Fluoxetine capsules or tablets (Depression/Mood Disorders) What is this medicine? FLUOXETINE (floo OX e teen) belongs to a class of drugs known as selective serotonin reuptake inhibitors (SSRIs). It helps to treat mood problems such as depression, obsessive compulsive disorder, and panic attacks. It can also treat certain eating disorders. This medicine may be used for other purposes; ask your health care provider or pharmacist if you have questions. COMMON BRAND NAME(S): Prozac What should I tell my health care provider before I take this medicine? They need to know if you have any of these conditions: -bipolar disorder or a family history of bipolar disorder -bleeding disorders -glaucoma -heart disease -liver disease -low levels of sodium in the blood -seizures -suicidal thoughts, plans, or attempt; a previous suicide attempt by you or a family member -take MAOIs like Carbex, Eldepryl, Marplan, Nardil, and Parnate -take medicines that treat or prevent blood clots -thyroid disease -an unusual or allergic reaction to fluoxetine, other medicines, foods, dyes, or preservatives -pregnant or trying to get pregnant -breast-feeding How should I use this medicine? Take this medicine by mouth with a glass of water. Follow the directions on the prescription label. You can take this medicine with or without food. Take your medicine at regular intervals.  Do not take it more often than directed. Do not stop taking this medicine suddenly except upon the advice of your doctor. Stopping this medicine too quickly may cause serious side effects or your condition may worsen. A special MedGuide will be given to you by the pharmacist with each prescription and refill. Be sure to read this information carefully each time. Talk to your pediatrician regarding the use of this medicine in children. While this drug may be prescribed for children as young as 7 years for selected conditions, precautions do apply. Overdosage: If you think you have taken too much of this medicine contact a poison control center or emergency room at once. NOTE: This medicine is only for you. Do not share this medicine with others. What if I miss a dose? If you miss a dose, skip the missed dose and go back to your regular dosing schedule. Do not take double or extra doses. What may interact with this medicine? Do not take this medicine with any of the following medications: -other medicines containing fluoxetine, like Sarafem or Symbyax -cisapride -linezolid -MAOIs like Carbex, Eldepryl, Marplan, Nardil, and Parnate -methylene blue (injected into a vein) -pimozide -thioridazine This medicine may also interact with the following medications: -alcohol -amphetamines -aspirin and aspirin-like medicines -carbamazepine -certain medicines for depression, anxiety, or psychotic disturbances -certain medicines for migraine headaches like almotriptan, eletriptan, frovatriptan, naratriptan, rizatriptan, sumatriptan, zolmitriptan -digoxin -diuretics -fentanyl -flecainide -furazolidone -isoniazid -lithium -medicines for sleep -medicines that treat or prevent blood clots like warfarin, enoxaparin, and dalteparin -NSAIDs, medicines for pain and inflammation, like ibuprofen or naproxen -phenytoin -procarbazine -propafenone -rasagiline -ritonavir -supplements like St. John's wort,  kava kava, valerian -tramadol -tryptophan -vinblastine This list may not describe all possible interactions. Give your health care provider a list of all the  medicines, herbs, non-prescription drugs, or dietary supplements you use. Also tell them if you smoke, drink alcohol, or use illegal drugs. Some items may interact with your medicine. What should I watch for while using this medicine? Tell your doctor if your symptoms do not get better or if they get worse. Visit your doctor or health care professional for regular checks on your progress. Because it may take several weeks to see the full effects of this medicine, it is important to continue your treatment as prescribed by your doctor. Patients and their families should watch out for new or worsening thoughts of suicide or depression. Also watch out for sudden changes in feelings such as feeling anxious, agitated, panicky, irritable, hostile, aggressive, impulsive, severely restless, overly excited and hyperactive, or not being able to sleep. If this happens, especially at the beginning of treatment or after a change in dose, call your health care professional. Misty QuinYou may get drowsy or dizzy. Do not drive, use machinery, or do anything that needs mental alertness until you know how this medicine affects you. Do not stand or sit up quickly, especially if you are an older patient. This reduces the risk of dizzy or fainting spells. Alcohol may interfere with the effect of this medicine. Avoid alcoholic drinks. Your mouth may get dry. Chewing sugarless gum or sucking hard candy, and drinking plenty of water may help. Contact your doctor if the problem does not go away or is severe. This medicine may affect blood sugar levels. If you have diabetes, check with your doctor or health care professional before you change your diet or the dose of your diabetic medicine. What side effects may I notice from receiving this medicine? Side effects that you should  report to your doctor or health care professional as soon as possible: -allergic reactions like skin rash, itching or hives, swelling of the face, lips, or tongue -anxious -black, tarry stools -breathing problems -changes in vision -confusion -elevated mood, decreased need for sleep, racing thoughts, impulsive behavior -eye pain -fast, irregular heartbeat -feeling faint or lightheaded, falls -feeling agitated, angry, or irritable -hallucination, loss of contact with reality -loss of balance or coordination -loss of memory -painful or prolonged erections -restlessness, pacing, inability to keep still -seizures -stiff muscles -suicidal thoughts or other mood changes -trouble sleeping -unusual bleeding or bruising -unusually weak or tired -vomiting Side effects that usually do not require medical attention (report to your doctor or health care professional if they continue or are bothersome): -change in appetite or weight -change in sex drive or performance -diarrhea -dry mouth -headache -increased sweating -nausea -tremors This list may not describe all possible side effects. Call your doctor for medical advice about side effects. You may report side effects to FDA at 1-800-FDA-1088. Where should I keep my medicine? Keep out of the reach of children. Store at room temperature between 15 and 30 degrees C (59 and 86 degrees F). Throw away any unused medicine after the expiration date. NOTE: This sheet is a summary. It may not cover all possible information. If you have questions about this medicine, talk to your doctor, pharmacist, or health care provider.  2018 Elsevier/Gold Standard (2015-10-07 15:55:27)      Misty Choi (Shingles) La culebrilla es una infeccin que causa una erupcin dolorosa en la piel y ampollas llenas de lquido. La culebrilla est causada por el mismo virus que causa la varicela. Solo se manifiesta en personas que:  Tuvieron varicela.  Recibieron  la vacuna contra la varicela. (Esto es  poco frecuente). Los primeros sntomas de la culebrilla pueden ser picazn, hormigueo o dolor en una zona de la piel. Luego de [employer] o semanas aparecer una erupcin cutnea. La erupcin suele presentarse en un solo lado del cuerpo en un patrn con forma de cinto o de banda. Con el tiempo, la erupcin se convierte en ampollas llenas de lquido que se abren, forman costras y se secan. Los medicamentos pueden [employer] siguientes efectos:  Ayudar a Human resources officer.  Lograr una recuperacin ms rpida.  Prevenir problemas a Air cabin crew. CUIDADOS EN EL HOGAR Medicamentos  Tome los medicamentos solamente como se lo haya indicado el mdico.  Aplique una crema para calmar la picazn o con anestesia en la zona afectada, como se lo haya indicado el mdico. Cuidado de la erupcin y las [address] un bao de agua fra o aplique compresas fras en la zona de la erupcin o las ampollas como se lo haya indicado el mdico. Esto Engineer, materials y Higher education careers adviser.  Mantenga la zona de la erupcin cubierta con una venda (vendaje). Use ropas sueltas.  Mantenga limpia la zona de la erupcin y las ampollas con [place] y agua fra, o como se lo haya indicado el mdico.  Controle la erupcin todos los [address]  [phone] para detectar signos de infeccin. Estos signos incluyen enrojecimiento, hinchazn o dolor que perduran o empeoran.  No pellizque las ampollas.  No se rasque la zona de la erupcin. Instrucciones generales  Haga reposo tal como le indic el mdico.  Concurra a todas las visitas de control como se lo haya indicado el mdico. Esto es importante.  Hasta tanto las ampollas formen costras, la infeccin puede causar varicela en las personas que nunca la tuvieron o no se vacunaron contra la varicela. Para impedir que esto sucede, evite tocar a Economist o estar cerca de otras personas, en especial: ? Bebs. ? Embarazadas. ? Nios que Engineer, manufacturing. ? Personas mayores que han recibido un trasplante. ? Personas con enfermedades crnicas, como leucemia y sida. SOLICITE AYUDA SI:  El dolor no mejora con medicamentos.  El dolor no mejora despus de que la erupcin desaparece.  La erupcin parece infectada. Los signos de infeccin son los siguiente: ? Enrojecimiento. ? Hinchazn. ? Dolor que perdura o [place]. SOLICITE AYUDA DE INMEDIATO SI:  La erupcin aparece en el rostro o la nariz.  Tiene dolor en el rostro, en la zona de los ojos o prdida de la sensibilidad en un lado del rostro.  Siente dolor o un zumbido en el odo.  Tiene prdida del gusto.  La afeccin empeora. Esta informacin no tiene Theme park manager el consejo del mdico. Asegrese de hacerle al mdico cualquier pregunta que tenga. Document Released: 08/02/2008 Document Revised: 08/28/2015 Document Reviewed: 02/15/2014 Elsevier Interactive Patient Education  2018 [employer].     IF you received an x-ray today, you will receive an invoice from [facility]. Please contact [facility] at [phone] with questions or concerns regarding your invoice.   IF you received labwork today, you will receive an invoice from [facility]. Please contact [facility] at [phone] with questions or concerns regarding your invoice.   Our billing staff will not be able to assist you with questions regarding bills from these companies.  You will be contacted with the lab results as soon as they are available. The fastest way to get your results is to activate your My Chart account. Instructions are located on the last page of  this paperwork. If you have not heard from [place] regarding the results in 2 weeks, please contact this office.

## 2017-09-11 ENCOUNTER — Encounter: Payer: Self-pay | Admitting: Urgent Care

## 2017-09-11 ENCOUNTER — Ambulatory Visit: Payer: Self-pay | Admitting: *Deleted

## 2017-09-11 NOTE — Telephone Encounter (Signed)
  Pt called with adverse reactions from taking fluoxetine. She started taking this med on the 09/04/17 and on 09/06/17 she started having nausea, diarrhea and the feeling of nervousness.  [facility]. Advised that the provider would give the patient a call back.  Pt advised of this and voiced understanding.  advised to call back if symptoms increase or if she feels worst. Pt voiced understanding.  Reason for Disposition . Caller has URGENT medication question about med that PCP prescribed and triager unable to answer question  Answer Assessment - Initial Assessment Questions 1. SYMPTOMS: "Do you have any symptoms?"     Yes, nausea, diarrhea and nervousness 2. SEVERITY: If symptoms are present, ask "Are they mild, moderate or severe?"     severe  Protocols used: MEDICATION QUESTION CALL-A-AH

## 2017-09-11 NOTE — Telephone Encounter (Signed)
Please advise 

## 2017-09-12 ENCOUNTER — Telehealth: Payer: Self-pay

## 2017-09-12 ENCOUNTER — Ambulatory Visit
Admission: RE | Admit: 2017-09-12 | Discharge: 2017-09-12 | Disposition: A | Discharge status: Home / Self Care | Payer: 59 | Source: Ambulatory Visit | Attending: Family Medicine | Admitting: Family Medicine

## 2017-09-12 ENCOUNTER — Ambulatory Visit: Payer: 59

## 2017-09-12 DIAGNOSIS — R928 Other abnormal and inconclusive findings on diagnostic imaging of breast: Secondary | ICD-10-CM | POA: Diagnosis not present

## 2017-09-12 DIAGNOSIS — N6489 Other specified disorders of breast: Secondary | ICD-10-CM

## 2017-09-12 NOTE — Telephone Encounter (Signed)
Duplicate message sent through [facility]chart to [facility].   Copied from CRM [phone]. Topic: General - Other >> Sep 12, 2017  7:19 AM Gerrianne ScalePayne, Angela L wrote: Reason for CRM: patient was Triaged yesterday and she's calling back she [facility] about her symptoms and wanted to know if she should keep taking medicine she is still having same symptoms pt was triaged by nurse yesterday

## 2017-09-12 NOTE — Telephone Encounter (Signed)
Patient is calling back today, still having symptoms and would like advice on what to do.   Provider, please advise.

## 2017-09-16 MED ORDER — ONDANSETRON 8 MG PO TBDP: 8.0000 mg | ORAL_TABLET | Freq: Three times a day (TID) | ORAL | 0 refills | Status: DC | PRN

## 2017-09-16 NOTE — Addendum Note (Signed)
Addended by: [name] on: 09/16/2017 09:26 AM   Modules accepted: Orders

## 2017-09-16 NOTE — Telephone Encounter (Signed)
Left voice message for patient recommending that we start Zofran to help her with her nausea and diarrhea.  I do want her to maintain fluoxetine for now.  She is to recheck with me in this upcoming week.  Scheduling staff, please schedule patient for follow-up this week.

## 2017-09-18 ENCOUNTER — Encounter: Payer: Self-pay | Admitting: Urgent Care

## 2017-09-18 ENCOUNTER — Ambulatory Visit (INDEPENDENT_AMBULATORY_CARE_PROVIDER_SITE_OTHER): Payer: 59 | Admitting: Urgent Care

## 2017-09-18 VITALS — BP 140/78 | HR 87 | Temp 98.5°F | Resp 16 | Ht 65.0 in | Wt 178.6 lb

## 2017-09-18 DIAGNOSIS — R112 Nausea with vomiting, unspecified: Secondary | ICD-10-CM | POA: Diagnosis not present

## 2017-09-18 DIAGNOSIS — R200 Anesthesia of skin: Secondary | ICD-10-CM | POA: Diagnosis not present

## 2017-09-18 DIAGNOSIS — R197 Diarrhea, unspecified: Secondary | ICD-10-CM

## 2017-09-18 DIAGNOSIS — R202 Paresthesia of skin: Secondary | ICD-10-CM | POA: Diagnosis not present

## 2017-09-18 DIAGNOSIS — Z789 Other specified health status: Secondary | ICD-10-CM | POA: Diagnosis not present

## 2017-09-18 DIAGNOSIS — F419 Anxiety disorder, unspecified: Secondary | ICD-10-CM | POA: Diagnosis not present

## 2017-09-18 NOTE — Progress Notes (Signed)
    MRN: [id number] DOB: 01/16/1971  Subjective:   Misty Choi is a 47 y.o. female presenting for follow up on anxiety.  Patient reports that she was unable to tolerate Prozac.  She is no longer taking this.  Also reports that she did not want to start Xanax either.  She states that she is trying to see a therapist.  She is not going to start any other medication until after that.  Denies fever, nausea, vomiting, belly pain, SI, HI.  She states that the facial tingling has gotten better with gabapentin, has not taken it as needed only.  Misty Choi has a current medication list which includes the following prescription(s): gabapentin. Also is allergic to azithromycin and nitrofurantoin monohyd macro.  Misty Choi  has a past medical history of Allergy. Also  has a past surgical history that includes Eye surgery and Cesarean section.  Objective:   Vitals: BP 140/78   Pulse 87   Temp 98.5 F (36.9 C) (Oral)   Resp 16   Ht  (1.651 m)   Wt 178 lb 9.6 oz (81 kg)   SpO2 100%   BMI 29.72 kg/m   Physical Exam  Constitutional: She is oriented to person, place, and time. She appears well-developed and well-nourished.  Cardiovascular: Normal rate.  Pulmonary/Chest: Effort normal.  Neurological: She is alert and oriented to person, place, and time.  Psychiatric:  Patient is restless in her seat, fidgets a lot and speech appears pressured.   Assessment and Plan :   Anxiety  Numbness and tingling of left side of face  Nausea, vomiting and diarrhea  Medication intolerance  We will hold off on starting any medications for now.  Counseled patient that she should use Xanax as she needs to for times when she feels really overwhelmed.  Continue efforts at behavioral therapy.  Follow-up in 3 months or sooner if necessary.  [name], PA-C [facility] [phone] 09/18/2017 2:51 PM

## 2017-09-18 NOTE — Patient Instructions (Addendum)
Sertraline tablets What is this medicine? SERTRALINE (SER tra leen) is used to treat depression. It may also be used to treat obsessive compulsive disorder, panic disorder, post-trauma stress, premenstrual dysphoric disorder (PMDD) or social anxiety. This medicine may be used for other purposes; ask your health care provider or pharmacist if you have questions. COMMON BRAND NAME(S): Zoloft What should I tell my health care provider before I take this medicine? They need to know if you have any of these conditions: -bleeding disorders -bipolar disorder or a family history of bipolar disorder -glaucoma -heart disease -high blood pressure -history of irregular heartbeat -history of low levels of calcium, magnesium, or potassium in the blood -if you often drink alcohol -liver disease -receiving electroconvulsive therapy -seizures -suicidal thoughts, plans, or attempt; a previous suicide attempt by you or a family member -take medicines that treat or prevent blood clots -thyroid disease -an unusual or allergic reaction to sertraline, other medicines, foods, dyes, or preservatives -pregnant or trying to get pregnant -breast-feeding How should I use this medicine? Take this medicine by mouth with a glass of water. Follow the directions on the prescription label. You can take it with or without food. Take your medicine at regular intervals. Do not take your medicine more often than directed. Do not stop taking this medicine suddenly except upon the advice of your doctor. Stopping this medicine too quickly may cause serious side effects or your condition may worsen. A special MedGuide will be given to you by the pharmacist with each prescription and refill. Be sure to read this information carefully each time. Talk to your pediatrician regarding the use of this medicine in children. While this drug may be prescribed for children as young as 7 years for selected conditions, precautions do  apply. Overdosage: If you think you have taken too much of this medicine contact a poison control center or emergency room at once. NOTE: This medicine is only for you. Do not share this medicine with others. What if I miss a dose? If you miss a dose, take it as soon as you can. If it is almost time for your next dose, take only that dose. Do not take double or extra doses. What may interact with this medicine? Do not take this medicine with any of the following medications: -cisapride -dofetilide -dronedarone -linezolid -MAOIs like Carbex, Eldepryl, Marplan, Nardil, and Parnate -methylene blue (injected into a vein) -pimozide -thioridazine This medicine may also interact with the following medications: -alcohol -amphetamines -aspirin and aspirin-like medicines -certain medicines for depression, anxiety, or psychotic disturbances -certain medicines for fungal infections like ketoconazole, fluconazole, posaconazole, and itraconazole -certain medicines for irregular heart beat like flecainide, quinidine, propafenone -certain medicines for migraine headaches like almotriptan, eletriptan, frovatriptan, naratriptan, rizatriptan, sumatriptan, zolmitriptan -certain medicines for sleep -certain medicines for seizures like carbamazepine, valproic acid, phenytoin -certain medicines that treat or prevent blood clots like warfarin, enoxaparin, dalteparin -cimetidine -digoxin -diuretics -fentanyl -isoniazid -lithium -NSAIDs, medicines for pain and inflammation, like ibuprofen or naproxen -other medicines that prolong the QT interval (cause an abnormal heart rhythm) -rasagiline -safinamide -supplements like St. John's wort, kava kava, valerian -tolbutamide -tramadol -tryptophan This list may not describe all possible interactions. Give your health care provider a list of all the medicines, herbs, non-prescription drugs, or dietary supplements you use. Also tell them if you smoke, drink  alcohol, or use illegal drugs. Some items may interact with your medicine. What should I watch for while using this medicine? Tell your doctor if your symptoms   do not get better or if they get worse. Visit your doctor or health care professional for regular checks on your progress. Because it may take several weeks to see the full effects of this medicine, it is important to continue your treatment as prescribed by your doctor. Patients and their families should watch out for new or worsening thoughts of suicide or depression. Also watch out for sudden changes in feelings such as feeling anxious, agitated, panicky, irritable, hostile, aggressive, impulsive, severely restless, overly excited and hyperactive, or not being able to sleep. If this happens, especially at the beginning of treatment or after a change in dose, call your health care professional. You may get drowsy or dizzy. Do not drive, use machinery, or do anything that needs mental alertness until you know how this medicine affects you. Do not stand or sit up quickly, especially if you are an older patient. This reduces the risk of dizzy or fainting spells. Alcohol may interfere with the effect of this medicine. Avoid alcoholic drinks. Your mouth may get dry. Chewing sugarless gum or sucking hard candy, and drinking plenty of water may help. Contact your doctor if the problem does not go away or is severe. What side effects may I notice from receiving this medicine? Side effects that you should report to your doctor or health care professional as soon as possible: -allergic reactions like skin rash, itching or hives, swelling of the face, lips, or tongue -anxious -black, tarry stools -changes in vision -confusion -elevated mood, decreased need for sleep, racing thoughts, impulsive behavior -eye pain -fast, irregular heartbeat -feeling faint or lightheaded, falls -feeling agitated, angry, or irritable -hallucination, loss of contact with  reality -loss of balance or coordination -loss of memory -painful or prolonged erections -restlessness, pacing, inability to keep still -seizures -stiff muscles -suicidal thoughts or other mood changes -trouble sleeping -unusual bleeding or bruising -unusually weak or tired -vomiting Side effects that usually do not require medical attention (report to your doctor or health care professional if they continue or are bothersome): -change in appetite or weight -change in sex drive or performance -diarrhea -increased sweating -indigestion, nausea -tremors This list may not describe all possible side effects. Call your doctor for medical advice about side effects. You may report side effects to FDA at 1-800-FDA-1088. Where should I keep my medicine? Keep out of the reach of children. Store at room temperature between 15 and 30 degrees C (59 and 86 degrees F). Throw away any unused medicine after the expiration date. NOTE: This sheet is a summary. It may not cover all possible information. If you have questions about this medicine, talk to your doctor, pharmacist, or health care provider.  2018 Elsevier/Gold Standard (2016-05-10 14:17:49)     IF you received an x-ray today, you will receive an invoice from [facility]. Please contact [facility] at [phone] with questions or concerns regarding your invoice.   IF you received labwork today, you will receive an invoice from [facility]. Please contact [facility] at [phone] with questions or concerns regarding your invoice.   Our billing staff will not be able to assist you with questions regarding bills from these companies.  You will be contacted with the lab results as soon as they are available. The fastest way to get your results is to activate your My Chart account. Instructions are located on the last page of this paperwork. If you have not heard from us regarding the results in 2 weeks, please contact this  office.      

## 2017-09-19 ENCOUNTER — Encounter: Payer: Self-pay | Admitting: Urgent Care

## 2017-09-23 DIAGNOSIS — M5032 Other cervical disc degeneration, mid-cervical region, unspecified level: Secondary | ICD-10-CM | POA: Diagnosis not present

## 2017-09-23 DIAGNOSIS — M9901 Segmental and somatic dysfunction of cervical region: Secondary | ICD-10-CM | POA: Diagnosis not present

## 2017-09-23 DIAGNOSIS — M531 Cervicobrachial syndrome: Secondary | ICD-10-CM | POA: Diagnosis not present

## 2017-09-24 DIAGNOSIS — M5032 Other cervical disc degeneration, mid-cervical region, unspecified level: Secondary | ICD-10-CM | POA: Diagnosis not present

## 2017-09-24 DIAGNOSIS — M9901 Segmental and somatic dysfunction of cervical region: Secondary | ICD-10-CM | POA: Diagnosis not present

## 2017-09-24 DIAGNOSIS — M531 Cervicobrachial syndrome: Secondary | ICD-10-CM | POA: Diagnosis not present

## 2017-09-26 DIAGNOSIS — M5032 Other cervical disc degeneration, mid-cervical region, unspecified level: Secondary | ICD-10-CM | POA: Diagnosis not present

## 2017-09-26 DIAGNOSIS — M9901 Segmental and somatic dysfunction of cervical region: Secondary | ICD-10-CM | POA: Diagnosis not present

## 2017-09-26 DIAGNOSIS — M531 Cervicobrachial syndrome: Secondary | ICD-10-CM | POA: Diagnosis not present

## 2017-09-30 DIAGNOSIS — M9901 Segmental and somatic dysfunction of cervical region: Secondary | ICD-10-CM | POA: Diagnosis not present

## 2017-09-30 DIAGNOSIS — M531 Cervicobrachial syndrome: Secondary | ICD-10-CM | POA: Diagnosis not present

## 2017-09-30 DIAGNOSIS — M5032 Other cervical disc degeneration, mid-cervical region, unspecified level: Secondary | ICD-10-CM | POA: Diagnosis not present

## 2017-10-02 DIAGNOSIS — M9901 Segmental and somatic dysfunction of cervical region: Secondary | ICD-10-CM | POA: Diagnosis not present

## 2017-10-02 DIAGNOSIS — M5032 Other cervical disc degeneration, mid-cervical region, unspecified level: Secondary | ICD-10-CM | POA: Diagnosis not present

## 2017-10-02 DIAGNOSIS — M531 Cervicobrachial syndrome: Secondary | ICD-10-CM | POA: Diagnosis not present

## 2017-10-07 DIAGNOSIS — M5032 Other cervical disc degeneration, mid-cervical region, unspecified level: Secondary | ICD-10-CM | POA: Diagnosis not present

## 2017-10-07 DIAGNOSIS — M9901 Segmental and somatic dysfunction of cervical region: Secondary | ICD-10-CM | POA: Diagnosis not present

## 2017-10-07 DIAGNOSIS — M531 Cervicobrachial syndrome: Secondary | ICD-10-CM | POA: Diagnosis not present

## 2017-10-09 DIAGNOSIS — M531 Cervicobrachial syndrome: Secondary | ICD-10-CM | POA: Diagnosis not present

## 2017-10-09 DIAGNOSIS — M5032 Other cervical disc degeneration, mid-cervical region, unspecified level: Secondary | ICD-10-CM | POA: Diagnosis not present

## 2017-10-09 DIAGNOSIS — M9901 Segmental and somatic dysfunction of cervical region: Secondary | ICD-10-CM | POA: Diagnosis not present

## 2017-10-14 DIAGNOSIS — M5032 Other cervical disc degeneration, mid-cervical region, unspecified level: Secondary | ICD-10-CM | POA: Diagnosis not present

## 2017-10-14 DIAGNOSIS — M531 Cervicobrachial syndrome: Secondary | ICD-10-CM | POA: Diagnosis not present

## 2017-10-14 DIAGNOSIS — M9901 Segmental and somatic dysfunction of cervical region: Secondary | ICD-10-CM | POA: Diagnosis not present

## 2017-10-16 DIAGNOSIS — M9901 Segmental and somatic dysfunction of cervical region: Secondary | ICD-10-CM | POA: Diagnosis not present

## 2017-10-16 DIAGNOSIS — M5032 Other cervical disc degeneration, mid-cervical region, unspecified level: Secondary | ICD-10-CM | POA: Diagnosis not present

## 2017-10-16 DIAGNOSIS — M531 Cervicobrachial syndrome: Secondary | ICD-10-CM | POA: Diagnosis not present

## 2017-10-21 DIAGNOSIS — M9901 Segmental and somatic dysfunction of cervical region: Secondary | ICD-10-CM | POA: Diagnosis not present

## 2017-10-21 DIAGNOSIS — M5032 Other cervical disc degeneration, mid-cervical region, unspecified level: Secondary | ICD-10-CM | POA: Diagnosis not present

## 2017-10-21 DIAGNOSIS — M531 Cervicobrachial syndrome: Secondary | ICD-10-CM | POA: Diagnosis not present

## 2017-10-25 ENCOUNTER — Other Ambulatory Visit: Payer: Self-pay

## 2017-10-25 ENCOUNTER — Ambulatory Visit (INDEPENDENT_AMBULATORY_CARE_PROVIDER_SITE_OTHER): Payer: 59 | Admitting: Family Medicine

## 2017-10-25 ENCOUNTER — Encounter: Payer: Self-pay | Admitting: Family Medicine

## 2017-10-25 VITALS — BP 100/84 | HR 95 | Temp 99.0°F | Resp 16 | Ht 63.0 in | Wt 168.0 lb

## 2017-10-25 DIAGNOSIS — I951 Orthostatic hypotension: Secondary | ICD-10-CM

## 2017-10-25 DIAGNOSIS — R200 Anesthesia of skin: Secondary | ICD-10-CM | POA: Diagnosis not present

## 2017-10-25 DIAGNOSIS — H9313 Tinnitus, bilateral: Secondary | ICD-10-CM

## 2017-10-25 DIAGNOSIS — M542 Cervicalgia: Secondary | ICD-10-CM | POA: Diagnosis not present

## 2017-10-25 DIAGNOSIS — F419 Anxiety disorder, unspecified: Secondary | ICD-10-CM

## 2017-10-25 DIAGNOSIS — Z0000 Encounter for general adult medical examination without abnormal findings: Secondary | ICD-10-CM | POA: Diagnosis not present

## 2017-10-25 DIAGNOSIS — M791 Myalgia, unspecified site: Secondary | ICD-10-CM | POA: Diagnosis not present

## 2017-10-25 DIAGNOSIS — E781 Pure hyperglyceridemia: Secondary | ICD-10-CM

## 2017-10-25 DIAGNOSIS — R202 Paresthesia of skin: Secondary | ICD-10-CM

## 2017-10-25 DIAGNOSIS — L819 Disorder of pigmentation, unspecified: Secondary | ICD-10-CM | POA: Diagnosis not present

## 2017-10-25 DIAGNOSIS — M62830 Muscle spasm of back: Secondary | ICD-10-CM | POA: Diagnosis not present

## 2017-10-25 DIAGNOSIS — N921 Excessive and frequent menstruation with irregular cycle: Secondary | ICD-10-CM

## 2017-10-25 MED ORDER — CYCLOBENZAPRINE HCL 5 MG PO TABS: 5.0000 mg | ORAL_TABLET | Freq: Three times a day (TID) | ORAL | 5 refills | Status: DC | PRN

## 2017-10-25 MED ORDER — MELOXICAM 15 MG PO TABS: 15.0000 mg | ORAL_TABLET | Freq: Every day | ORAL | 0 refills | Status: DC

## 2017-10-25 NOTE — Patient Instructions (Addendum)
Try melatonin 3 to 5 mg at night but call if you want to try something mild that might also help with nerves.  Or ok to take 2 flexeril and even add in a gabapentin to that to help sleep at night if you think it would help.  Make sure you are continuing with your healthy diet, lots of water, plenty of protein and iron, and now you need to add in exercise - consider yoga which can be an amazing workout but also so good for your neck, jaw, back, stress, etc - there are TONS of great yoga groupons where you can get 10 classes or an unlimited month for $20-40 which would be perfect for you over the summer.  Sometimes adding in magnesium or vitamin E to a B complex regimen can help with night leg cramps - or taking 2 of the flexeril before bed.    I'll let you know if anything else comes back to give [place] a clue about anything underlying that might be contributing beyond stress/anxiety.    IF you received an x-ray today, you will receive an invoice from [facility]. Please contact [facility] at [phone] with questions or concerns regarding your invoice.   IF you received labwork today, you will receive an invoice from [place]. Please contact [facility] at [phone] with questions or concerns regarding your invoice.   Our billing staff will not be able to assist you with questions regarding bills from these companies.  You will be contacted with the lab results as soon as they are available. The fastest way to get your results is to activate your My Chart account. Instructions are located on the last page of this paperwork. If you have not heard from [place] regarding the results in 2 weeks, please contact this office.     Stress and Stress Management Stress is a normal reaction to life events. It is what you feel when life demands more than you are used to or more than you can handle. Some stress can be useful. For example, the stress reaction can help you catch the last bus of the  day, study for a test, or meet a deadline at work. But stress that occurs too often or for too long can cause problems. It can affect your emotional health and interfere with relationships and normal daily activities. Too much stress can weaken your immune system and increase your risk for physical illness. If you already have a medical problem, stress can make it worse. What are the causes? All sorts of life events may cause stress. An event that causes stress for one person may not be stressful for another person. Major life events commonly cause stress. These may be positive or negative. Examples include losing your job, moving into a new home, getting married, having a baby, or losing a loved one. Less obvious life events may also cause stress, especially if they occur day after day or in combination. Examples include working long hours, driving in traffic, caring for children, being in debt, or being in a difficult relationship. What are the signs or symptoms? Stress may cause emotional symptoms including, the following:  Anxiety. This is feeling worried, afraid, on edge, overwhelmed, or out of control.  Anger. This is feeling irritated or impatient.  Depression. This is feeling sad, down, helpless, or guilty.  Difficulty focusing, remembering, or making decisions.  Stress may cause physical symptoms, including the following:  Aches and pains. These may affect your head, neck, back,  stomach, or other areas of your body.  Tight muscles or clenched jaw.  Low energy or trouble sleeping.  Stress may cause unhealthy behaviors, including the following:  Eating to feel better (overeating) or skipping meals.  Sleeping too little, too much, or both.  Working too much or putting off tasks (procrastination).  Smoking, drinking alcohol, or using drugs to feel better.  How is this diagnosed? Stress is diagnosed through an assessment by your health care provider. Your health care provider  will ask questions about your symptoms and any stressful life events.Your health care provider will also ask about your medical history and may order blood tests or other tests. Certain medical conditions and medicine can cause physical symptoms similar to stress. Mental illness can cause emotional symptoms and unhealthy behaviors similar to stress. Your health care provider may refer you to a mental health professional for further evaluation. How is this treated? Stress management is the recommended treatment for stress.The goals of stress management are reducing stressful life events and coping with stress in healthy ways. Techniques for reducing stressful life events include the following:  Stress identification. Self-monitor for stress and identify what causes stress for you. These skills may help you to avoid some stressful events.  Time management. Set your priorities, keep a calendar of events, and learn to say "no." These tools can help you avoid making too many commitments.  Techniques for coping with stress include the following:  Rethinking the problem. Try to think realistically about stressful events rather than ignoring them or overreacting. Try to find the positives in a stressful situation rather than focusing on the negatives.  Exercise. Physical exercise can release both physical and emotional tension. The key is to find a form of exercise you enjoy and do it regularly.  Relaxation techniques. These relax the body and mind. Examples include yoga, meditation, tai chi, biofeedback, deep breathing, progressive muscle relaxation, listening to music, being out in nature, journaling, and other hobbies. Again, the key is to find one or more that you enjoy and can do regularly.  Healthy lifestyle. Eat a balanced diet, get plenty of sleep, and do not smoke. Avoid using alcohol or drugs to relax.  Strong support network. Spend time with family, friends, or other people you enjoy being  around.Express your feelings and talk things over with someone you trust.  Counseling or talktherapy with a mental health professional may be helpful if you are having difficulty managing stress on your own. Medicine is typically not recommended for the treatment of stress.Talk to your health care provider if you think you need medicine for symptoms of stress. Follow these instructions at home:  Keep all follow-up visits as directed by your health care provider.  Take all medicines as directed by your health care provider. Contact a health care provider if:  Your symptoms get worse or you start having new symptoms.  You feel overwhelmed by your problems and can no longer manage them on your own. Get help right away if:  You feel like hurting yourself or someone else. This information is not intended to replace advice given to you by your health care provider. Make sure you discuss any questions you have with your health care provider. Document Released: 10/30/2000 Document Revised: 10/12/2015 Document Reviewed: 12/29/2012 Elsevier Interactive Patient Education  2017 [place] Stress Reduction Mindfulness-based stress reduction (MBSR) is a program that helps people learn to practice mindfulness. Mindfulness is the practice of intentionally paying attention to the present  moment. It can be learned and practiced through techniques such as education, breathing exercises, meditation, and yoga. MBSR includes several mindfulness techniques in one program. MBSR works best when you understand the treatment, are willing to try new things, and can commit to spending time practicing what you learn. MBSR training may include learning about:  How your emotions, thoughts, and reactions affect your body.  New ways to respond to things that cause negative thoughts to start (triggers).  How to notice your thoughts and let go of them.  Practicing awareness of everyday things that  you normally do without thinking.  The techniques and goals of different types of meditation.  What are the benefits of MBSR? MBSR can have many benefits, which include helping you to:  Develop self-awareness. This refers to knowing and understanding yourself.  Learn skills and attitudes that help you to participate in your own health care.  Learn new ways to care for yourself.  Be more accepting about how things are, and let things go.  Be less judgmental and approach things with an open mind.  Be patient with yourself and trust yourself more.  MBSR has also been shown to:  Reduce negative emotions, such as depression and anxiety.  Improve memory and focus.  Change how you sense and approach pain.  Boost your body's ability to fight infections.  Help you connect better with other people.  Improve your sense of well-being.  Follow these instructions at home:  Find a local in-person or online MBSR program.  Set aside some time regularly for mindfulness practice.  Find a mindfulness practice that works best for you. This may include one or more of the following: ? Meditation. Meditation involves focusing your mind on a certain thought or activity. ? Breathing awareness exercises. These help you to stay present by focusing on your breath. ? Body scan. For this practice, you lie down and pay attention to each part of your body from head to toe. You can identify tension and soreness and intentionally relax parts of your body. ? Yoga. Yoga involves stretching and breathing, and it can improve your ability to move and be flexible. It can also provide an experience of testing your body's limits, which can help you release stress. ? Mindful eating. This way of eating involves focusing on the taste, texture, color, and smell of each bite of food. Because this slows down eating and helps you feel full sooner, it can be an important part of a weight-loss plan.  Find a podcast or  recording that provides guidance for breathing awareness, body scan, or meditation exercises. You can listen to these any time when you have a free moment to rest without distractions.  Follow your treatment plan as told by your health care provider. This may include taking regular medicines and making changes to your diet or lifestyle as recommended. How to practice mindfulness To do a basic awareness exercise:  Find a comfortable place to sit.  Pay attention to the present moment. Observe your thoughts, feelings, and surroundings just as they are.  Avoid placing judgment on yourself, your feelings, or your surroundings. Make note of any judgment that comes up, and let it go.  Your mind may wander, and that is okay. Make note of when your thoughts drift, and return your attention to the present moment.  To do basic mindfulness meditation:  Find a comfortable place to sit. This may include a stable chair or a firm floor cushion. ? Sit upright  with your back straight. Let your arms fall next to your side with your hands resting on your legs. ? If sitting in a chair, rest your feet flat on the floor. ? If sitting on a cushion, cross your legs in front of you.  Keep your head in a neutral position with your chin dropped slightly. Relax your jaw and rest the tip of your tongue on the roof of your mouth. Drop your gaze to the floor. You can close your eyes if you like.  Breathe normally and pay attention to your breath. Feel the air moving in and out of your nose. Feel your belly expanding and relaxing with each breath.  Your mind may wander, and that is okay. Make note of when your thoughts drift, and return your attention to your breath.  Avoid placing judgment on yourself, your feelings, or your surroundings. Make note of any judgment or feelings that come up, let them go, and bring your attention back to your breath.  When you are ready, lift your gaze or open your eyes. Pay attention to  how your body feels after the meditation.  Where to find more information: You can find more information about MBSR from:  Your health care provider.  Community-based meditation centers or programs.  Programs offered near you.  Summary  Mindfulness-based stress reduction (MBSR) is a program that teaches you how to intentionally pay attention to the present moment. It is used with other treatments to help you cope better with daily stress, emotions, and pain.  MBSR focuses on developing self-awareness, which allows you to respond to life stress without judgment or negative emotions.  MBSR programs may involve learning different mindfulness practices, such as breathing exercises, meditation, yoga, body scan, or mindful eating. Find a mindfulness practice that works best for you, and set aside time for it on a regular basis. This information is not intended to replace advice given to you by your health care provider. Make sure you discuss any questions you have with your health care provider. Document Released: 09/12/2016 Document Revised: 09/12/2016 Document Reviewed: 09/12/2016 Elsevier Interactive Patient Education  [employer].

## 2017-10-25 NOTE — Progress Notes (Addendum)
Subjective:  By signing my name below, I, Misty Choi, attest that this documentation has been prepared under the direction and in the presence of Misty Sorenson, MD Electronically Signed: Charline Bills, ED Scribe 10/25/2017 at 8:17 AM.   Patient ID: Misty Choi, female    DOB: 10/02/1970, 48 y.o.   MRN: [id number]  Chief Complaint  Patient presents with  . medication review    Flexeril   . Tinnitus    per patient 2 years - both   HPI Misty Choi "Misty Choi" is a 47 y.o. female who presents to Primary Care at [facility] for the flexeril medication refill and review of her chronic medical conccerns.   Neck and upper back muscle pain/spasms States she has been seeing a Technical sales engineer and using Flexeril in the morning. Reports some tightness in the R face that she attributes to increased stress at work, with relief. Taking Flexeril 2-3 times/daily at some times - rarely - but without any significant fatigue.  H/o trigeminal neuralgia on the L that resolved after her tooth was removed and with gabapentin. Takes gabapentin as needed but very worried about overdoing it.  Irregular Menstruation - Metromenorrhagia Reports heavy, irregular menstrual periods that occur two-three times/month. She reports having 3 in Nov on the 3rd, 23rd and 31st with low BP and lightheadedness. Occasionally took iron supplements in Jan which she is no longer taking.  Tinnitus Both ears x 56yrs.  GI Upset Pt reports diarrhea with eating foods that are too sweet or greasy. Also reports recent acid reflux, also likely due to stress. States she has been eating rice, oats and water.  Wt Readings from Last 3 Encounters:  10/25/17 168 lb (76.2 kg)  09/18/17 178 lb 9.6 oz (81 kg)  08/26/17 183 lb 9.6 oz (83.3 kg)   Anxiety She saw Urban Gibson 2 mos ago who recommended she start on prozac 20 but she thought it looked to severe/strong for her so never started it. Reports nausea, tingling in her extremities that  she attributes to increased stress. Pt has an upcoming appointment with a counselor on 7/2.  Doesn't want to have to take pills if there is an option   Rash/Color Change She has also noticed hyperpigmentation under her breasts and neck. States her father had similar hyperpigmentation (AN) to various areas such as his neck due to DM. Denies changes to urine, visual disturbances, polydipsia.  Difficulty Sleeping Pt goes to sleep at 8 PM but wakes around 3 AM. She has tried Sleepy Time Tea without relief, Chamomile Tea with some relief. She has not tried melatonin.   Nevus Pt has also noticed a mole to her L leg several yrs ago which has grown in size over the past few months.  Plans to work in [employer] department over the summer. 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  Past Medical History:  Diagnosis Date  . Allergy    Current Outpatient Medications on File Prior to Visit  Medication Sig Dispense Refill  . cyclobenzaprine (FLEXERIL) 5 MG tablet Take 5 mg by mouth 3 (three) times daily as needed for muscle spasms.    Marland Kitchen gabapentin (NEURONTIN) 300 MG capsule Take 1 capsule (300 mg total) by mouth 3 (three) times daily. 90 capsule 3   No current facility-administered medications on file prior to visit.    Past Surgical History:  Procedure Laterality Date  . CESAREAN SECTION    .  EYE SURGERY     Allergies  Allergen Reactions  . Azithromycin Rash  . Nitrofurantoin Monohyd Macro Rash   Family History  Problem Relation Age of Onset  . Diabetes Mother   . Heart disease Mother   . Hyperlipidemia Mother   . Hypertension Mother   . Diabetes Father   . Heart disease Father   . Hyperlipidemia Father   . Hypertension Father   . Stroke Father   . Diabetes Maternal Grandmother   . Heart disease Maternal Grandmother   . Stroke Maternal Grandmother   . Diabetes Paternal Grandmother   . Hypertension Paternal Grandmother   . Breast cancer  Neg Hx    Social History   Socioeconomic History  . Marital status: Married    Spouse name: Not on file  . Number of children: Not on file  . Years of education: Not on file  . Highest education level: Not on file  Occupational History  . Not on file  Social Needs  . Financial resource strain: Not on file  . Food insecurity:    Worry: Not on file    Inability: Not on file  . Transportation needs:    Medical: Not on file    Non-medical: Not on file  Tobacco Use  . Smoking status: Never Smoker  . Smokeless tobacco: Never Used  Substance and Sexual Activity  . Alcohol use: Yes  . Drug use: No  . Sexual activity: Yes    Birth control/protection: IUD, Condom  Lifestyle  . Physical activity:    Days per week: Not on file    Minutes per session: Not on file  . Stress: Not on file  Relationships  . Social connections:    Talks on phone: Not on file    Gets together: Not on file    Attends religious service: Not on file    Active member of club or organization: Not on file    Attends meetings of clubs or organizations: Not on file    Relationship status: Not on file  Other Topics Concern  . Not on file  Social History Narrative  . Not on file   Depression screen [facility] 2/9 08/12/2017 07/08/2017 04/07/2017 11/11/2016 05/17/2016  Decreased Interest 0 0 0 0 0  Down, Depressed, Hopeless 0 0 0 0 0  PHQ - 2 Score 0 0 0 0 0     Review of Systems  HENT: Positive for tinnitus.   Eyes: Negative for visual disturbance.  Gastrointestinal: Positive for diarrhea (intermittent) and nausea (intermittent).  Endocrine: Negative for polydipsia.  Genitourinary: Positive for menstrual problem. Negative for hematuria.  Skin: Positive for color change and rash.  Neurological: Positive for light-headedness (occasional).  Psychiatric/Behavioral: Positive for sleep disturbance. The patient is nervous/anxious.       Objective:   Physical Exam  Constitutional: She is oriented to person, place,  and time. She appears well-developed and well-nourished. No distress.  HENT:  Head: Normocephalic and atraumatic.  Right Ear: A middle ear effusion (small) is present.  Left Ear: A middle ear effusion (small) is present.  Nose: Nose normal.  Mouth/Throat: Oropharynx is clear and moist and mucous membranes are normal.  Eyes: Conjunctivae and EOM are normal.  Neck: Neck supple. No tracheal deviation present. No thyroid mass and no thyromegaly present.  Cardiovascular: Normal rate, regular rhythm and normal heart sounds.  Pulmonary/Chest: Effort normal and breath sounds normal. No respiratory distress.  Lungs are clear to auscultation. Good air movement.  Musculoskeletal:  Normal range of motion.  Lymphadenopathy:    She has no cervical adenopathy.  Neurological: She is alert and oriented to person, place, and time.  Skin: Skin is warm and dry. Rash noted. Rash is macular.  Macular hyperpigmented tiny pinpoint areas that coalescent into larger hyperpigmented areas.  Psychiatric: She has a normal mood and affect. Her behavior is normal.  Nursing note and vitals reviewed.   BP 100/84 (BP Location: Left Arm)   Pulse 95   Temp 99 F (37.2 C) (Oral)   Resp 16   Ht 5\' 3"  (1.6 m)   Wt 168 lb (76.2 kg)   LMP 10/12/2017   SpO2 100%   BMI 29.76 kg/m   Orthostatic VS for the past 24 hrs:  BP- Lying Pulse- Lying BP- Sitting Pulse- Sitting BP- Standing at 0 minutes Pulse- Standing at 0 minutes  10/25/17 0903 122/79 75 120/81 83 119/83 95        Assessment & Plan:    1. Annual physical exam   2. Neck discomfort   3. Numbness and tingling of left side of face   4. Spasm of thoracic back muscle   5. Myalgia - meloxicam prn - does not use < 1/2 of days, ok to cont use flexeril prn since tolerated it so well prior w/o sedation from it or gabapentin at night   6. Menometrorrhagia  - check labs  7. Hyperpigmentation  - pt reassured no DM- under breast is just friction/pressure  hyperpigmentation from chronic stress/irritation of bra underwire - no concern for DD Lab Results  Component Value Date   HGBA1C 5.6 10/25/2017    8. Orthostatic hypotension - BP staying stable throughout position change  9. Hypertriglyceridemia  - improved!  10. Bilateral tinnitus - x 2  Yrs and not not taking any meds/vit for most of that time - offered ENT referral again  11. Anxiety  - severe aversion to prozac, will stick w/ starting to see therapist for now and RTC if she wants to augment with meds.  Try sleepy time tea or melatonin to help sleep - if not effective, call and I will call in amitriptyline 10mg  but pt very reluctant to start any med currently.    Orders Placed This Encounter  Procedures  . TSH  . Vitamin B12  . VITAMIN D 25 Hydroxy (Vit-D Deficiency, Fractures)  . CBC with Differential/Platelet  . Comprehensive metabolic panel    Order Specific Question:   Has the patient fasted?    Answer:   Yes  . Magnesium  . CK  . Sedimentation Rate  . FSH/LH  . Hemoglobin A1c  . Lipid panel    Order Specific Question:   Has the patient fasted?    Answer:   Yes  . Iron, TIBC and Ferritin Panel    Meds ordered this encounter  Medications  . cyclobenzaprine (FLEXERIL) 5 MG tablet    Sig: Take 1 tablet (5 mg total) by mouth 3 (three) times daily as needed for muscle spasms. Take 2 tabs po qhs to prevent muscle spasms    Dispense:  30 tablet    Refill:  5  . meloxicam (MOBIC) 15 MG tablet    Sig: Take 1 tablet (15 mg total) by mouth daily. As needed for inflammation and pain    Dispense:  30 tablet    Refill:  0     I personally performed the services described in this documentation, which was scribed in my presence. The  recorded information has been reviewed and considered, and addended by me as needed.   Misty Choi, M.D.  Primary Care at [facility] [address] [place], [place] [address] [phone] phone [phone] fax  11/19/17 3:21 AM

## 2017-10-27 LAB — COMPREHENSIVE METABOLIC PANEL
ALK PHOS: 62 IU/L (ref 39–117)
ALT: 14 IU/L (ref 0–32)
AST: 18 IU/L (ref 0–40)
Albumin/Globulin Ratio: 1.9 (ref 1.2–2.2)
Albumin: 4.4 g/dL (ref 3.5–5.5)
BUN/Creatinine Ratio: 12 (ref 9–23)
BUN: 8 mg/dL (ref 6–24)
Bilirubin Total: 0.4 mg/dL (ref 0.0–1.2)
CALCIUM: 8.8 mg/dL (ref 8.7–10.2)
CO2: 22 mmol/L (ref 20–29)
CREATININE: 0.68 mg/dL (ref 0.57–1.00)
Chloride: 104 mmol/L (ref 96–106)
GFR calc Af Amer: 120 mL/min/{1.73_m2} (ref >59)
GFR, EST NON AFRICAN AMERICAN: 105 mL/min/{1.73_m2} (ref >59)
GLUCOSE: 98 mg/dL (ref 65–99)
Globulin, Total: 2.3 g/dL (ref 1.5–4.5)
Potassium: 4.2 mmol/L (ref 3.5–5.2)
Sodium: 141 mmol/L (ref 134–144)
Total Protein: 6.7 g/dL (ref 6.0–8.5)

## 2017-10-27 LAB — LIPID PANEL
CHOL/HDL RATIO: 4.8 ratio — AB (ref 0.0–4.4)
Cholesterol, Total: 168 mg/dL (ref 100–199)
HDL: 35 mg/dL — ABNORMAL LOW (ref >39)
LDL CALC: 98 mg/dL (ref 0–99)
Triglycerides: 173 mg/dL — ABNORMAL HIGH (ref 0–149)
VLDL Cholesterol Cal: 35 mg/dL (ref 5–40)

## 2017-10-27 LAB — IRON,TIBC AND FERRITIN PANEL
Ferritin: 189 ng/mL — ABNORMAL HIGH (ref 15–150)
IRON: 99 ug/dL (ref 27–159)
Iron Saturation: 35 % (ref 15–55)
Total Iron Binding Capacity: 283 ug/dL (ref 250–450)
UIBC: 184 ug/dL (ref 131–425)

## 2017-10-27 LAB — MAGNESIUM: MAGNESIUM: 2.2 mg/dL (ref 1.6–2.3)

## 2017-10-27 LAB — CBC WITH DIFFERENTIAL/PLATELET
BASOS ABS: 0 10*3/uL (ref 0.0–0.2)
Basos: 0 %
EOS (ABSOLUTE): 0 10*3/uL (ref 0.0–0.4)
EOS: 0 %
HEMOGLOBIN: 13.3 g/dL (ref 11.1–15.9)
Hematocrit: 40 % (ref 34.0–46.6)
IMMATURE GRANULOCYTES: 0 %
Immature Grans (Abs): 0 10*3/uL (ref 0.0–0.1)
LYMPHS ABS: 1.4 10*3/uL (ref 0.7–3.1)
LYMPHS: 17 %
MCH: 31.8 pg (ref 26.6–33.0)
MCHC: 33.3 g/dL (ref 31.5–35.7)
MCV: 96 fL (ref 79–97)
Monocytes Absolute: 0.5 10*3/uL (ref 0.1–0.9)
Monocytes: 6 %
NEUTROS PCT: 77 %
Neutrophils Absolute: 6.5 10*3/uL (ref 1.4–7.0)
Platelets: 393 10*3/uL (ref 150–450)
RBC: 4.18 x10E6/uL (ref 3.77–5.28)
RDW: 14.2 % (ref 12.3–15.4)
WBC: 8.4 10*3/uL (ref 3.4–10.8)

## 2017-10-27 LAB — VITAMIN B12: Vitamin B-12: 1060 pg/mL (ref 232–1245)

## 2017-10-27 LAB — FSH/LH
FSH: 5.5 m[IU]/mL
LH: 2.7 m[IU]/mL

## 2017-10-27 LAB — SEDIMENTATION RATE: Sed Rate: 12 mm/hr (ref 0–32)

## 2017-10-27 LAB — HEMOGLOBIN A1C
Est. average glucose Bld gHb Est-mCnc: 114 mg/dL
Hgb A1c MFr Bld: 5.6 % (ref 4.8–5.6)

## 2017-10-27 LAB — VITAMIN D 25 HYDROXY (VIT D DEFICIENCY, FRACTURES): VIT D 25 HYDROXY: 28.6 ng/mL — AB (ref 30.0–100.0)

## 2017-10-27 LAB — TSH: TSH: 0.923 u[IU]/mL (ref 0.450–4.500)

## 2017-10-27 LAB — CK: CK TOTAL: 140 U/L (ref 24–173)

## 2017-10-28 DIAGNOSIS — M531 Cervicobrachial syndrome: Secondary | ICD-10-CM | POA: Diagnosis not present

## 2017-10-28 DIAGNOSIS — M9901 Segmental and somatic dysfunction of cervical region: Secondary | ICD-10-CM | POA: Diagnosis not present

## 2017-10-28 DIAGNOSIS — M5032 Other cervical disc degeneration, mid-cervical region, unspecified level: Secondary | ICD-10-CM | POA: Diagnosis not present

## 2017-11-04 DIAGNOSIS — M531 Cervicobrachial syndrome: Secondary | ICD-10-CM | POA: Diagnosis not present

## 2017-11-04 DIAGNOSIS — M9901 Segmental and somatic dysfunction of cervical region: Secondary | ICD-10-CM | POA: Diagnosis not present

## 2017-11-04 DIAGNOSIS — M5032 Other cervical disc degeneration, mid-cervical region, unspecified level: Secondary | ICD-10-CM | POA: Diagnosis not present

## 2017-11-11 DIAGNOSIS — M531 Cervicobrachial syndrome: Secondary | ICD-10-CM | POA: Diagnosis not present

## 2017-11-11 DIAGNOSIS — M9901 Segmental and somatic dysfunction of cervical region: Secondary | ICD-10-CM | POA: Diagnosis not present

## 2017-11-11 DIAGNOSIS — M5032 Other cervical disc degeneration, mid-cervical region, unspecified level: Secondary | ICD-10-CM | POA: Diagnosis not present

## 2017-11-19 DIAGNOSIS — E781 Pure hyperglyceridemia: Secondary | ICD-10-CM | POA: Insufficient documentation

## 2017-11-19 DIAGNOSIS — N921 Excessive and frequent menstruation with irregular cycle: Secondary | ICD-10-CM | POA: Insufficient documentation

## 2017-11-19 DIAGNOSIS — H9313 Tinnitus, bilateral: Secondary | ICD-10-CM | POA: Insufficient documentation

## 2017-11-25 DIAGNOSIS — M50222 Other cervical disc displacement at C5-C6 level: Secondary | ICD-10-CM | POA: Diagnosis not present

## 2017-11-25 DIAGNOSIS — M50323 Other cervical disc degeneration at C6-C7 level: Secondary | ICD-10-CM | POA: Diagnosis not present

## 2017-11-25 DIAGNOSIS — M542 Cervicalgia: Secondary | ICD-10-CM | POA: Diagnosis not present

## 2017-11-25 DIAGNOSIS — M545 Low back pain: Secondary | ICD-10-CM | POA: Diagnosis not present

## 2017-11-25 DIAGNOSIS — M5124 Other intervertebral disc displacement, thoracic region: Secondary | ICD-10-CM | POA: Diagnosis not present

## 2017-11-25 DIAGNOSIS — M791 Myalgia, unspecified site: Secondary | ICD-10-CM | POA: Diagnosis not present

## 2018-02-11 NOTE — Addendum Note (Signed)
Addended by: Sherren Mocha on: 02/11/2018 12:59 PM   Modules accepted: Level of Service

## 2018-02-23 ENCOUNTER — Ambulatory Visit (INDEPENDENT_AMBULATORY_CARE_PROVIDER_SITE_OTHER): Payer: 59 | Admitting: Psychiatry

## 2018-02-23 DIAGNOSIS — F411 Generalized anxiety disorder: Secondary | ICD-10-CM | POA: Diagnosis not present

## 2018-02-23 NOTE — Progress Notes (Signed)
      [facility] Counselor/Therapist Progress Note   Patient ID: Misty Choi, MRN: [id number]  Date: 02/23/2018  Timespent: 50 minutes  Treatment Type: Individual  Subjective: Patient was present for session.  Patient reported she is made some progress with her anxiety but it was still pretty bad over the past 2 weeks.  Patient stated that he got to the point where she would come to get this when he began having some panic.  Patient shared the things that have been triggering her anxiety recently.  Most of them seem to be work related.  Patient explained she feels that much pressure that she is the primary breadwinner and her family at this time.  Patient did not E MDR set on 1 of the issues from school.  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  The S UDS level was 10, negative cognition "I am not enough", felt guilt in her chest.  Patient was able to reduce the S UDS level to 1.  She reported understanding she has to remind herself she cannot be perfect and she just has to focus on doing everything she can in the day.  Patient was encouraged to use her CBT skills to get through each day at school.  Patient also reported she has difficulty sometimes with sleep due to her thoughts.  Patient was encouraged to think of all the things that she is thankful for in those moments to see if that directs her thoughts in a more positive direction.  Completed treatment plan with patient at end of session.  Interventions:CBT, Solution Focused and Supportive  Mental Status Exam:   Appearance:   Neat     Behavior:  Appropriate  Motor:  Normal  Speech/Language:   Normal Rate  Affect:  Appropriate  Mood:  anxious  Thought process:  Coherent  Thought content:    Logical  Perceptual disturbances:    Normal  Orientation:  Full (Time, Place, and Person)  Attention:  Good  Concentration:  good  Memory:  Immediate  Fund of knowledge:    Good  Insight:    Good  Judgment:   Good  Impulse Control:  good    Reported Symptoms: anxiety,panic attacks, body aches, sleep issues  Risk Assessment: Danger to Self:  No Self-injurious Behavior: No Danger to Others: No Duty to Warn:no Physical Aggression / Violence:No  Access to Firearms a concern: No  Gang Involvement:No   Diagnosis:   ICD-10-CM   1. Generalized anxiety disorder F41.1      Plan: 1.  Patient to continue to engage in individual counseling 2-4 times a month or as needed. 2.  Patient to identify and apply CBT, coping skills learned in session to decrease anxiety symptoms. 3.  Patient to contact this office, go to the local ED or call 911 if a crisis or emergency develops between visits.  Stevphen Meuse, [place]

## 2018-03-01 DIAGNOSIS — Z23 Encounter for immunization: Secondary | ICD-10-CM | POA: Diagnosis not present

## 2018-04-13 ENCOUNTER — Ambulatory Visit (INDEPENDENT_AMBULATORY_CARE_PROVIDER_SITE_OTHER): Payer: 59 | Admitting: Psychiatry

## 2018-04-13 ENCOUNTER — Encounter: Payer: Self-pay | Admitting: Psychiatry

## 2018-04-13 DIAGNOSIS — F411 Generalized anxiety disorder: Secondary | ICD-10-CM | POA: Diagnosis not present

## 2018-04-13 NOTE — Progress Notes (Signed)
      [facility] Counselor/Therapist Progress Note  Patient ID: Misty Choi, MRN: [id number],    Date: 04/13/2018  Time Spent: 47 minutes  Treatment Type: Individual Therapy  Reported Symptoms: Anxious Mood  Mental Status Exam:  Appearance:   Well Groomed     Behavior:  Appropriate  Motor:  Normal  Speech/Language:   Normal Rate  Affect:  Appropriate  Mood:  normal  Thought process:  normal  Thought content:    WNL  Sensory/Perceptual disturbances:    WNL  Orientation:  oriented to person, place and time/date  Attention:  Good  Concentration:  Good  Memory:  WNL  Fund of knowledge:   Good  Insight:    Good  Judgment:   Good  Impulse Control:  Good   Risk Assessment: Danger to Self:  No Self-injurious Behavior: No Danger to Others: No Duty to Warn:no Physical Aggression / Violence:No  Access to Firearms a concern: No  Gang Involvement:No   Subjective: Patient was present for session.  Patient reported that she was feeling much better.  She shared that even though she has some anxiety it is not creating health issues for her any longer and she is at a much better place.  Patient explained that she has started a second job and that has taken off some of her financial stress which has improved her mood.  Patient went on to explain she continues utilizing her coping skills from previous sessions and works on trying to find ways to relieve stress appropriately.  Discussed how the combination of all of those things has decreased patient's anxiety significantly.  Patient reported she notices her daughter also having anxiety issues.  She was encouraged to teach her coping skills that have been helpful so she can learn how to manage her anxiety as well as patient.  Patient agreed to continue utilizing her coping skills.  She will contact clinician if she needs to return for treatment.  Interventions: Solution-Oriented/Positive Psychology  Diagnosis:   ICD-10-CM   1.  Generalized anxiety disorder F41.1     Plan: 1.  Patient to continue to engage in individual counseling  as needed. 2.  Patient to identify and apply CBT, coping skills learned in treatment to decrease anxiety symptoms. 3.  Patient to contact this office, go to the local ED or call 911 if a crisis or emergency develops between visits.  Stevphen MeuseHolly Chavie Kolinski, [place]

## 2018-06-19 ENCOUNTER — Other Ambulatory Visit: Payer: Self-pay | Admitting: Family Medicine

## 2018-06-24 ENCOUNTER — Other Ambulatory Visit: Payer: Self-pay | Admitting: Family Medicine

## 2019-05-31 DIAGNOSIS — Z8616 Personal history of COVID-19: Secondary | ICD-10-CM | POA: Insufficient documentation

## 2019-06-02 ENCOUNTER — Ambulatory Visit: Payer: 59 | Attending: Internal Medicine

## 2019-06-02 DIAGNOSIS — Z20822 Contact with and (suspected) exposure to covid-19: Secondary | ICD-10-CM

## 2019-06-03 LAB — NOVEL CORONAVIRUS, NAA: SARS-CoV-2, NAA: DETECTED — AB

## 2019-07-08 ENCOUNTER — Ambulatory Visit: Payer: Self-pay | Admitting: Internal Medicine

## 2019-07-14 ENCOUNTER — Other Ambulatory Visit: Payer: Self-pay

## 2019-07-14 ENCOUNTER — Ambulatory Visit: Payer: 59 | Admitting: Internal Medicine

## 2019-07-14 ENCOUNTER — Encounter: Payer: Self-pay | Admitting: Internal Medicine

## 2019-07-14 VITALS — BP 132/78 | HR 77 | Temp 98.8°F | Ht 63.0 in | Wt 177.0 lb

## 2019-07-14 DIAGNOSIS — F419 Anxiety disorder, unspecified: Secondary | ICD-10-CM

## 2019-07-14 DIAGNOSIS — L819 Disorder of pigmentation, unspecified: Secondary | ICD-10-CM

## 2019-07-14 DIAGNOSIS — G8929 Other chronic pain: Secondary | ICD-10-CM | POA: Diagnosis not present

## 2019-07-14 DIAGNOSIS — M79601 Pain in right arm: Secondary | ICD-10-CM | POA: Diagnosis not present

## 2019-07-14 DIAGNOSIS — Z8781 Personal history of (healed) traumatic fracture: Secondary | ICD-10-CM | POA: Insufficient documentation

## 2019-07-14 DIAGNOSIS — L509 Urticaria, unspecified: Secondary | ICD-10-CM | POA: Insufficient documentation

## 2019-07-14 DIAGNOSIS — Z8616 Personal history of COVID-19: Secondary | ICD-10-CM

## 2019-07-14 DIAGNOSIS — Z0001 Encounter for general adult medical examination with abnormal findings: Secondary | ICD-10-CM

## 2019-07-14 DIAGNOSIS — Z8349 Family history of other endocrine, nutritional and metabolic diseases: Secondary | ICD-10-CM

## 2019-07-14 DIAGNOSIS — Z8639 Personal history of other endocrine, nutritional and metabolic disease: Secondary | ICD-10-CM | POA: Insufficient documentation

## 2019-07-14 MED ORDER — MELOXICAM 15 MG PO TABS: 15.0000 mg | ORAL_TABLET | Freq: Every day | ORAL | 0 refills | Status: DC

## 2019-07-14 NOTE — Patient Instructions (Signed)
Insulin Resistance  Insulin is a hormone that helps to control blood sugar (glucose) levels in the body. It is made in the pancreas. Insulin allows glucose to enter cells in the body. Insulin sensitivity refers to how the body responds to insulin. Insulin resistance occurs when cells in the body do not respond properly to insulin made by the pancreas and are not able to absorb glucose from the bloodstream. Insulin resistance results in high blood glucose levels (hyperglycemia) and can lead to problems, including:  Prediabetes.  Type 2 diabetes (type 2 diabetes mellitus).  Heart disease.  High blood pressure (hypertension).  Stroke.  Polycystic ovary syndrome (PCOS).  Nonalcoholic fatty liver disease. What are the causes? The exact cause of insulin resistance is not known. What increases the risk? The following factors may make you more likely to develop insulin resistance:  Being overweight or obese, especially if a lot of your weight is in your waist area.  Having an inactive (sedentary) lifestyle.  Using steroids.  Being older than age 45.  Having sleep apnea.  Using tobacco products. What are the signs or symptoms? This condition usually does not cause symptoms. How is this diagnosed? There is no test to diagnose insulin resistance. However, your health care provider may diagnose insulin resistance based on:  Your blood glucose levels.  Your cholesterol levels.  A measurement of the distance around your waist (circumference). A waist circumference of more than 35 inches (88.9 cm) for women and more than 40 inches (101.6 cm) for men may be a sign of insulin resistance.  Your risk factors.  A physical exam.  Your medical history. How is this treated? Insulin resistance is treated with nutrition and lifestyle changes. These changes may include:  Eating a healthy balance of nutritious foods.  Getting more physical activity.  Maintaining a healthy  weight.  Stopping the use of any tobacco products. Your health care provider will work with you to change your nutrition and lifestyle as needed. In some cases, treatment may also include medicine to improve your insulin sensitivity. Follow these instructions at home: Activity  Be physically active. Do moderate-intensity physical activity for 30 minutes or more on 5 or more days of the week, or as much as told by your health care provider. This could be brisk walking, biking, or water aerobics.  Ask your health care provider what activities are safe for you. A mix of physical activities may be best, such as walking, swimming, cycling, and strength training. Eating and drinking   Follow a healthy meal plan. This includes eating lean proteins, complex carbohydrates, fresh fruits and vegetables, low-fat dairy products, and healthy fats.  Follow instructions from your health care provider about eating or drinking restrictions.  Make an appointment to see a diet and nutrition specialist (registered dietitian) to help you create a healthy eating plan. General instructions  Check your blood glucose levels as told by your health care provider.  Take over-the-counter and prescription medicines only as told by your health care provider.  Lose weight as told by your health care provider. ? Losing 5-7% of your body weight can reverse insulin resistance. ? Your health care provider can determine how much weight loss is best for you and can help you lose weight safely.  Do not use any tobacco products, such as cigarettes, chewing tobacco, and e-cigarettes. If you need help quitting, ask your health care provider.  Keep all follow-up visits as told by your health care provider. This is important. Contact   a health care provider if:  You have trouble losing weight or maintaining your goal weight.  You gain weight.  You have trouble following your prescribed meal plan.  You have trouble  exercising more. Summary  Insulin resistance occurs when cells in the body do not respond properly to insulin made by the pancreas and are not able to absorb blood sugar (glucose) from the bloodstream.  Your health care provider will work with you to change your nutrition and lifestyle as needed. Treatment may also include medicine to improve your insulin sensitivity.  Keep all follow-up visits as told by your health care provider. This is important. This information is not intended to replace advice given to you by your health care provider. Make sure you discuss any questions you have with your health care provider. Document Revised: 04/18/2017 Document Reviewed: 06/09/2015 Elsevier Patient Education  2020 Elsevier Inc.  

## 2019-07-14 NOTE — Progress Notes (Signed)
This visit occurred during the SARS-CoV-2 public health emergency.  Safety protocols were in place, including screening questions prior to the visit, additional usage of staff PPE, and extensive cleaning of exam room while observing appropriate contact time as indicated for disinfecting solutions.  Subjective:     Patient ID: Misty Choi , female    DOB: 02-Feb-1971 , 49 y.o.   MRN: [id number]   Chief Complaint  Patient presents with  . Establish Care    HPI Pt is here to establish care with our practice.   Her PCP left 2 years ago when she had her yearly check up.  Has hx of anxiety and was placed on prozac and could not tolerate it since it caused sever GI SE's. She takes Radiola, chamomille tea, practices deep breathing, Ashuawanga, and yoga stretches which helps her well along with councellor since last year. Her anxiety stated duein ga aseason her husband in [place] and she was alone with her 49 y/o   2- has has hx of veritgo and gets trapezius tension and was prescribed Flexerill and Mobic which has helped a lot. She goes to chiropractor q 2 weeks for adjustments and sees a  massage therapist as well.  Past Medical History:  Diagnosis Date  . Allergy      Family History  Problem Relation Age of Onset  . Diabetes Mother   . Heart disease Mother   . Hyperlipidemia Mother   . Hypertension Mother   . Diabetes Father   . Heart disease Father   . Hyperlipidemia Father   . Hypertension Father   . Stroke Father   . Diabetes Maternal Grandmother   . Heart disease Maternal Grandmother   . Stroke Maternal Grandmother   . Diabetes Paternal Grandmother   . Hypertension Paternal Grandmother   . Breast cancer Neg Hx     Current Outpatient Medications on File Prior to Visit  Medication Sig Dispense Refill  . cyclobenzaprine (FLEXERIL) 5 MG tablet Take 1 tablet (5 mg total) by mouth 3 (three) times daily as needed for muscle spasms. Take 2 tabs po qhs to prevent muscle spasms 30  tablet 5  . meloxicam (MOBIC) 15 MG tablet Take 1 tablet (15 mg total) by mouth daily. As needed for inflammation and pain 30 tablet 0   No current facility-administered medications on file prior to visit.     Allergies  Allergen Reactions  . Azithromycin Rash  . Nitrofurantoin Monohyd Macro Rash     Review of Systems  Gets R arm aches, R elbow from ulnar nerve irritation. Gets tense traps, has chronic pigmented skin x 3 years, gets anxiety episodes but not as bad since going to counseling. The rest of 10 point ROS is neg.  Today's Vitals   07/14/19 1548  BP: 132/78  Pulse: 77  Temp: 98.8 F (37.1 C)  TempSrc: Oral  Weight: 177 lb (80.3 kg)  Height: 5\' 3"  (1.6 m)   Body mass index is 31.35 kg/m.   Objective:  Physical Exam   Constitutional: She is oriented to person, place, and time. She appears well-developed and well-nourished. No distress.  HENT:  Head: Normocephalic and atraumatic.  Right Ear: External ear normal.  Left Ear: External ear normal.  Nose: Nose normal.  Eyes: Conjunctivae are normal. Right eye exhibits no discharge. Left eye exhibits no discharge. No scleral icterus.  Neck: Neck supple. No thyromegaly present.  No carotid bruits bilaterally  Cardiovascular: Normal rate and regular rhythm.  No  murmur heard. Pulmonary/Chest: Effort normal and breath sounds normal. No respiratory distress.  Musculoskeletal: Normal range of motion. She exhibits no edema.  Lymphadenopathy:    She has no cervical adenopathy.  Neurological: She is alert and oriented to person, place, and time.  Skin: Skin is warm and dry. Capillary refill takes less than 2 seconds. No rash noted. She is not diaphoretic. She looks like she is tanned on her arms and chest. Has hyperpigmented skin to below breast, but normal skin color below this area and not as dark on volar arms.  Psychiatric: She has a normal mood and affect. Her behavior is normal. Judgment and thought content normal.   Nursing note reviewed.  Assessment And Plan:     1. History of COVID-19- resolved and has recovered fine.   2. History of vitamin D deficiency- chronic. Takin Vit D now qd.   3. Abnormal pigmentation of skin- chronic.  - ACTH; Future - Cortisol-am, blood - Insulin, random(561); Future - CMP14 + Anion Gap; Future - CBC with Diff; Future  4. Anxiety- stable with current regiment she follows. Will continue this.  - TSH; Future  5. chronic R arm pain- chronic. No action.  - Lipid panel; Future - TSH; Future - T4, Free; Future - T3, free; Future  6. Family history of thyroid disease- history.  - TSH; Future  She will come back for full labs and physical in the next month.  Evelina Lore RODRIGUEZ-SOUTHWORTH, PA-C    THE PATIENT IS ENCOURAGED TO PRACTICE SOCIAL DISTANCING DUE TO THE COVID-19 PANDEMIC.

## 2019-07-17 ENCOUNTER — Ambulatory Visit: Payer: 59 | Attending: Internal Medicine

## 2019-07-17 DIAGNOSIS — Z23 Encounter for immunization: Secondary | ICD-10-CM

## 2019-07-17 NOTE — Progress Notes (Signed)
   Covid-19 Vaccination Clinic  Name:  [name]    MRN: [id number] DOB: 06-May-1971  07/17/2019  [name] was observed post Covid-19 immunization for 15 minutes without incidence. She was provided with Vaccine Information Sheet and instruction to access the V-Safe system.   [name] was instructed to call 911 with any severe reactions post vaccine: Marland Kitchen Difficulty breathing  . Swelling of your face and throat  . A fast heartbeat  . A bad rash all over your body  . Dizziness and weakness    Immunizations Administered    Name Date Dose VIS Date Route   Pfizer COVID-19 Vaccine 07/17/2019  6:05 PM 0.3 mL 04/30/2019 Intramuscular   Manufacturer: [employer], [employer]   Lot: [id number]   NDC: [address]-8

## 2019-08-07 ENCOUNTER — Ambulatory Visit: Payer: 59 | Attending: Internal Medicine

## 2019-08-07 DIAGNOSIS — Z23 Encounter for immunization: Secondary | ICD-10-CM

## 2019-08-07 NOTE — Progress Notes (Signed)
   Covid-19 Vaccination Clinic  Name:  Dayanna Pryce    MRN: [id number] DOB: [birth year]  08/07/2019  [name] was observed post Covid-19 immunization for 15 minutes without incident. She was provided with Vaccine Information Sheet and instruction to access the V-Safe system.   Ms. Sharayah Renfrow was instructed to call 911 with any severe reactions post vaccine: Marland Kitchen Difficulty breathing  . Swelling of face and throat  . A fast heartbeat  . A bad rash all over body  . Dizziness and weakness   Immunizations Administered    Name Date Dose VIS Date Route   Pfizer COVID-19 Vaccine 08/07/2019  9:28 AM 0.3 mL 04/30/2019 Intramuscular   Manufacturer: [employer], [employer]   Lot: [id number]   NDC: [address]-5

## 2019-08-14 ENCOUNTER — Encounter: Payer: Self-pay | Admitting: Internal Medicine

## 2019-08-19 ENCOUNTER — Other Ambulatory Visit: Payer: Self-pay

## 2019-08-19 ENCOUNTER — Other Ambulatory Visit: Payer: 59

## 2019-08-19 DIAGNOSIS — F419 Anxiety disorder, unspecified: Secondary | ICD-10-CM

## 2019-08-19 DIAGNOSIS — Z8349 Family history of other endocrine, nutritional and metabolic diseases: Secondary | ICD-10-CM

## 2019-08-19 DIAGNOSIS — L819 Disorder of pigmentation, unspecified: Secondary | ICD-10-CM

## 2019-08-19 DIAGNOSIS — Z0001 Encounter for general adult medical examination with abnormal findings: Secondary | ICD-10-CM

## 2019-08-20 LAB — LIPID PANEL
Chol/HDL Ratio: 4.2 ratio (ref 0.0–4.4)
Cholesterol, Total: 192 mg/dL (ref 100–199)
HDL: 46 mg/dL (ref >39)
LDL Chol Calc (NIH): 117 mg/dL — ABNORMAL HIGH (ref 0–99)
Triglycerides: 167 mg/dL — ABNORMAL HIGH (ref 0–149)
VLDL Cholesterol Cal: 29 mg/dL (ref 5–40)

## 2019-08-20 LAB — CBC WITH DIFFERENTIAL/PLATELET
Basophils Absolute: 0 10*3/uL (ref 0.0–0.2)
Basos: 0 %
EOS (ABSOLUTE): 0.2 10*3/uL (ref 0.0–0.4)
Eos: 3 %
Hematocrit: 39.8 % (ref 34.0–46.6)
Hemoglobin: 13.7 g/dL (ref 11.1–15.9)
Immature Grans (Abs): 0 10*3/uL (ref 0.0–0.1)
Immature Granulocytes: 0 %
Lymphocytes Absolute: 1.7 10*3/uL (ref 0.7–3.1)
Lymphs: 26 %
MCH: 32.5 pg (ref 26.6–33.0)
MCHC: 34.4 g/dL (ref 31.5–35.7)
MCV: 95 fL (ref 79–97)
Monocytes Absolute: 0.5 10*3/uL (ref 0.1–0.9)
Monocytes: 8 %
Neutrophils Absolute: 4.2 10*3/uL (ref 1.4–7.0)
Neutrophils: 63 %
Platelets: 309 10*3/uL (ref 150–450)
RBC: 4.21 x10E6/uL (ref 3.77–5.28)
RDW: 13.2 % (ref 11.7–15.4)
WBC: 6.7 10*3/uL (ref 3.4–10.8)

## 2019-08-20 LAB — CMP14 + ANION GAP
ALT: 15 IU/L (ref 0–32)
AST: 18 IU/L (ref 0–40)
Albumin/Globulin Ratio: 2.1 (ref 1.2–2.2)
Albumin: 4.4 g/dL (ref 3.8–4.8)
Alkaline Phosphatase: 58 IU/L (ref 39–117)
Anion Gap: 12 mmol/L (ref 10.0–18.0)
BUN/Creatinine Ratio: 15 (ref 9–23)
BUN: 12 mg/dL (ref 6–24)
Bilirubin Total: 0.5 mg/dL (ref 0.0–1.2)
CO2: 23 mmol/L (ref 20–29)
Calcium: 8.9 mg/dL (ref 8.7–10.2)
Chloride: 102 mmol/L (ref 96–106)
Creatinine, Ser: 0.81 mg/dL (ref 0.57–1.00)
GFR calc Af Amer: 99 mL/min/{1.73_m2} (ref >59)
GFR calc non Af Amer: 86 mL/min/{1.73_m2} (ref >59)
Globulin, Total: 2.1 g/dL (ref 1.5–4.5)
Glucose: 87 mg/dL (ref 65–99)
Potassium: 4.3 mmol/L (ref 3.5–5.2)
Sodium: 137 mmol/L (ref 134–144)
Total Protein: 6.5 g/dL (ref 6.0–8.5)

## 2019-08-20 LAB — INSULIN, RANDOM: INSULIN: 8.6 u[IU]/mL (ref 2.6–24.9)

## 2019-08-20 LAB — T3, FREE: T3, Free: 2.8 pg/mL (ref 2.0–4.4)

## 2019-08-20 LAB — TSH: TSH: 0.862 u[IU]/mL (ref 0.450–4.500)

## 2019-08-20 LAB — T4, FREE: Free T4: 1.33 ng/dL (ref 0.82–1.77)

## 2019-08-20 LAB — ACTH: ACTH: 12.4 pg/mL (ref 7.2–63.3)

## 2019-09-02 ENCOUNTER — Ambulatory Visit: Payer: 59 | Admitting: Internal Medicine

## 2019-09-02 ENCOUNTER — Encounter: Payer: Self-pay | Admitting: Internal Medicine

## 2019-09-02 ENCOUNTER — Other Ambulatory Visit: Payer: Self-pay

## 2019-09-02 VITALS — BP 118/76 | HR 82 | Temp 98.7°F | Ht 63.0 in | Wt 173.8 lb

## 2019-09-02 DIAGNOSIS — N6320 Unspecified lump in the left breast, unspecified quadrant: Secondary | ICD-10-CM | POA: Diagnosis not present

## 2019-09-02 DIAGNOSIS — Z0001 Encounter for general adult medical examination with abnormal findings: Secondary | ICD-10-CM | POA: Diagnosis not present

## 2019-09-02 DIAGNOSIS — Z1231 Encounter for screening mammogram for malignant neoplasm of breast: Secondary | ICD-10-CM

## 2019-09-02 DIAGNOSIS — R21 Rash and other nonspecific skin eruption: Secondary | ICD-10-CM

## 2019-09-02 DIAGNOSIS — H9313 Tinnitus, bilateral: Secondary | ICD-10-CM

## 2019-09-02 DIAGNOSIS — N63 Unspecified lump in unspecified breast: Secondary | ICD-10-CM

## 2019-09-02 NOTE — Progress Notes (Addendum)
This visit occurred during the SARS-CoV-2 public health emergency.  Safety protocols were in place, including screening questions prior to the visit, additional usage of staff PPE, and extensive cleaning of exam room while observing appropriate contact time as indicated for disinfecting solutions.  Subjective:     Patient ID: Misty Choi , female    DOB: August 19, 1970 , 49 y.o.   MRN: [id number]   Chief Complaint  Patient presents with  . Annual Exam    breast exam mole on (R) thigh area    HPI Pt is here for her annual exam. She is on her period right now.  Noticed a lump below L breast this week when she was doing self exam and is a little sore today and thinks is due to pressing on it a lot, since it was not tender initially.    Past Medical History:  Diagnosis Date  . Allergy      Family History  Problem Relation Age of Onset  . Diabetes Mother   . Heart disease Mother   . Hyperlipidemia Mother   . Hypertension Mother   . Diabetes Father   . Heart disease Father   . Hyperlipidemia Father   . Hypertension Father   . Stroke Father   . Diabetes Maternal Grandmother   . Heart disease Maternal Grandmother   . Stroke Maternal Grandmother   . Diabetes Paternal Grandmother   . Hypertension Paternal Grandmother   . Breast cancer Neg Hx      Current Outpatient Medications:  .  diphenhydrAMINE (BENADRYL) 25 mg capsule, Take 25 mg by mouth every 6 (six) hours as needed., Disp: , Rfl:  .  meloxicam (MOBIC) 15 MG tablet, Take 1 tablet (15 mg total) by mouth daily. As needed for inflammation and pain prn, Disp: 90 tablet, Rfl: 0 .  cyclobenzaprine (FLEXERIL) 5 MG tablet, Take 1 tablet (5 mg total) by mouth 3 (three) times daily as needed for muscle spasms. Take 2 tabs po qhs to prevent muscle spasms (Patient not taking: Reported on 09/02/2019), Disp: 30 tablet, Rfl: 5   Allergies  Allergen Reactions  . Pumpkin Flavor     Mild allergy  . Shrimp [Shellfish Allergy]     Mild  allergy  . Azithromycin Rash  . Nitrofurantoin Monohyd Macro Rash     Review of Systems  Rash around nose since 06/2018 from wearing paper masks, went to urgent care and was tried on cortisone cream which hepls and if she does not wear a mask . + tinnitus for years and never seen ENT for this. Denies dizziness or decreased hearing. Mole on R thigh x 12 y getting larger, but not painful or changing colors or bleeding.  The enlargement has been over the past 12 years. A few days ago it got red and irritated and is unsure if she got an insect bite that caused it. Now is back to its normal look. She will come back for her pap.  Today's Vitals   09/02/19 1456  BP: 118/76  Pulse: 82  Temp: 98.7 F (37.1 C)  TempSrc: Oral  SpO2: 97%  Weight: 173 lb 12.8 oz (78.8 kg)  Height: 5\' 3"  (1.6 m)   Body mass index is 30.79 kg/m.   Objective:  Physical Exam  BP 118/76 (BP Location: Left Arm, Patient Position: Sitting, Cuff Size: Normal)   Pulse 82   Temp 98.7 F (37.1 C) (Oral)   Ht 5\' 3"  (1.6 m)   Wt 173  lb 12.8 oz (78.8 kg)   SpO2 97%   BMI 30.79 kg/m   General Appearance:    Alert, cooperative, no distress, appears stated age  Head:    Normocephalic, without obvious abnormality, atraumatic  Eyes:    PERRL, conjunctiva/corneas clear, EOM's intact, fundi    benign, both eyes  Ears:    Normal TM's and external ear canals, both ears  Nose:   Nares normal, septum midline, mucosa normal, no drainage    or sinus tenderness  Throat:   Lips, mucosa, and tongue normal; teeth and gums normal  Neck:   Supple, symmetrical, trachea midline, no adenopathy;    thyroid:  no enlargement/tenderness/nodules; no carotid   bruit  Back:     Symmetric, no curvature, ROM normal  Lungs:     Clear to auscultation bilaterally, respirations unlabored  Chest Wall:    No tenderness or deformity   Heart:    Regular rate and rhythm, S1 and S2 normal, no murmur, rub   or gallop  Breast Exam:    No tenderness, or  nipple abnormality. Has a mobile 1x1 cm mass on lower L breast edge.   Abdomen:     Soft, non-tender, bowel sounds active all four quadrants,    no masses, no organomegaly        Extremities:   Extremities normal, atraumatic, no cyanosis or edema  Pulses:   2+ and symmetric all extremities  Skin:   Skin color, texture, turgor normal, no rashes or lesions. Has a flat light brown mole with regular borders on anterior proximal thigh. She has mild red cheeks, but the picture she showed me when she gets worse looks like butterfly rash.   Lymph nodes:   Cervical, supraclavicular, and axillary nodes normal  Neurologic:   CNII-XII intact, normal strength, sensation and reflexes    Throughout. Normal Romberg, tandem gait, tip toe and heel gait and finger to nose.        Assessment And Plan:    1. Rash of face- around nose is healing, on cheeks seems improved. Could just be Rosacea.  - Lupus Anticoagulant Panel - Ambulatory referral to Dermatology  2. Encounter for general adult medical examination with abnormal findings- routine. Fu 1 y  3. Breast lump- L breast.  - MM Digital Diagnostic Unilat L; Future  4. Tinnitus of both ears- chronic - Ambulatory referral to ENT    Rhetta Cleek RODRIGUEZ-SOUTHWORTH, PA-C    THE PATIENT IS ENCOURAGED TO PRACTICE SOCIAL DISTANCING DUE TO THE COVID-19 PANDEMIC.

## 2019-09-02 NOTE — Patient Instructions (Signed)

## 2019-09-03 LAB — LUPUS ANTICOAGULANT PANEL
Dilute Viper Venom Time: 29.3 s (ref 0.0–47.0)
PTT Lupus Anticoagulant: 30.3 s (ref 0.0–51.9)

## 2019-09-05 NOTE — Addendum Note (Signed)
Addended by: Radene Knee on: 09/05/2019 04:19 PM   Modules accepted: Level of Service

## 2019-09-07 ENCOUNTER — Other Ambulatory Visit: Payer: Self-pay | Admitting: Internal Medicine

## 2019-09-07 DIAGNOSIS — R21 Rash and other nonspecific skin eruption: Secondary | ICD-10-CM

## 2019-09-07 DIAGNOSIS — N6489 Other specified disorders of breast: Secondary | ICD-10-CM

## 2019-09-07 DIAGNOSIS — N63 Unspecified lump in unspecified breast: Secondary | ICD-10-CM

## 2019-09-16 NOTE — Addendum Note (Signed)
Addended by: Radene Knee on: 09/16/2019 05:26 PM   Modules accepted: Level of Service

## 2019-09-20 ENCOUNTER — Other Ambulatory Visit: Payer: Self-pay

## 2019-09-20 ENCOUNTER — Ambulatory Visit
Admission: RE | Admit: 2019-09-20 | Discharge: 2019-09-20 | Disposition: A | Discharge status: Home / Self Care | Payer: 59 | Source: Ambulatory Visit | Attending: Internal Medicine | Admitting: Internal Medicine

## 2019-09-20 DIAGNOSIS — N63 Unspecified lump in unspecified breast: Secondary | ICD-10-CM

## 2019-09-20 DIAGNOSIS — N6489 Other specified disorders of breast: Secondary | ICD-10-CM

## 2019-10-07 ENCOUNTER — Other Ambulatory Visit: Payer: Self-pay | Admitting: Internal Medicine

## 2019-11-01 ENCOUNTER — Encounter (INDEPENDENT_AMBULATORY_CARE_PROVIDER_SITE_OTHER): Payer: Self-pay | Admitting: Otolaryngology

## 2019-11-01 ENCOUNTER — Other Ambulatory Visit: Payer: Self-pay

## 2019-11-01 ENCOUNTER — Ambulatory Visit (INDEPENDENT_AMBULATORY_CARE_PROVIDER_SITE_OTHER): Payer: 59 | Admitting: Otolaryngology

## 2019-11-01 VITALS — Temp 97.7°F

## 2019-11-01 DIAGNOSIS — H9313 Tinnitus, bilateral: Secondary | ICD-10-CM

## 2019-11-01 NOTE — Progress Notes (Signed)
HPI: Misty Choi is a 49 y.o. female who presents is referred by her PCP for evaluation of chronic tinnitus she has had for 2 or 3 years.  She really does not notice hearing problems.  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  Denies exposure to any explosions.  She feels like she hears fairly well although her mother is losing her hearing.  She is referred here for further evaluation of her tinnitus and hearing test..  Past Medical History:  Diagnosis Date  . Allergy    Past Surgical History:  Procedure Laterality Date  . CESAREAN SECTION    . EYE SURGERY     Social History   Socioeconomic History  . Marital status: Married    Spouse name: Not on file  . Number of children: Not on file  . Years of education: Not on file  . Highest education level: Not on file  Occupational History  . Not on file  Tobacco Use  . Smoking status: Never Smoker  . Smokeless tobacco: Never Used  Substance and Sexual Activity  . Alcohol use: Yes  . Drug use: No  . Sexual activity: Yes    Birth control/protection: I.U.D., Condom  Other Topics Concern  . Not on file  Social History Narrative  . Not on file   Social Determinants of Health   Financial Resource Strain:   . Difficulty of Paying Living Expenses:   Food Insecurity:   . Worried About Charity fundraiser in the Last Year:   . Arboriculturist in the Last Year:   Transportation Needs:   . Film/video editor (Medical):   Marland Kitchen Lack of Transportation (Non-Medical):   Physical Activity:   . Days of Exercise per Week:   . Minutes of Exercise per Session:   Stress:   . Feeling of Stress :   Social Connections:   . Frequency of Communication with Friends and Family:   . Frequency of Social Gatherings with Friends and Family:   . Attends Religious Services:   . Active Member of Clubs or Organizations:   . Attends Archivist Meetings:   Marland Kitchen Marital Status:    Family  History  Problem Relation Age of Onset  . Diabetes Mother   . Heart disease Mother   . Hyperlipidemia Mother   . Hypertension Mother   . Diabetes Father   . Heart disease Father   . Hyperlipidemia Father   . Hypertension Father   . Stroke Father   . Diabetes Maternal Grandmother   . Heart disease Maternal Grandmother   . Stroke Maternal Grandmother   . Diabetes Paternal Grandmother   . Hypertension Paternal Grandmother   . Breast cancer Neg Hx    Allergies  Allergen Reactions  . Pumpkin Flavor     Mild allergy  . Shrimp [Shellfish Allergy]     Mild allergy  . Azithromycin Rash  . Nitrofurantoin Monohyd Macro Rash   Prior to Admission medications   Medication Sig Start Date End Date Taking? Authorizing Provider  cyclobenzaprine (FLEXERIL) 5 MG tablet Take 1 tablet (5 mg total) by mouth 3 (three) times daily as needed for muscle spasms. Take 2 tabs po qhs to prevent muscle spasms 10/25/17  Yes Shawnee Knapp, MD  diphenhydrAMINE (BENADRYL) 25 mg capsule Take 25 mg by mouth every 6 (six) hours as needed.   Yes [provider]  meloxicam (MOBIC) 15 MG tablet TAKE  1 TABLET (15 MG TOTAL) BY MOUTH DAILY. AS NEEDED FOR INFLAMMATION AND PAIN 10/07/19  Yes Rodriguez-Southworth, Nettie Elm, PA-C     Positive ROS: Otherwise negative  All other systems have been reviewed and were otherwise negative with the exception of those mentioned in the HPI and as above.  Physical Exam: Constitutional: Alert, well-appearing, no acute distress Ears: External ears without lesions or tenderness. Ear canals are clear bilaterally.  TMs are clear bilaterally with good mobility pneumatic otoscopy. Nasal: External nose without lesions. Septum midline.. Clear nasal passages Oral: Lips and gums without lesions. Tongue and palate mucosa without lesions. Posterior oropharynx clear. Neck: No palpable adenopathy or masses Respiratory: Breathing comfortably  Skin: No facial/neck lesions or rash  noted.  Audiogram demonstrated a minimal downsloping SNHL in both ears with essentially normal speech reception threshold of 5 dB in the right ear and 15 dB in the left ear.  She had type a tympanograms bilaterally.  Procedures  Assessment: Tinnitus  Plan: Discussed with her concerning using masking noise when the tinnitus is bothersome.  There is limited treatment for tinnitus. I discussed with her concerning using ear protection when around loud noise. Reviewed with her concerning normal hearing evaluation except for mild high-frequency SNHL. She will follow-up as needed.   Narda Bonds, MD   CC:

## 2019-11-04 ENCOUNTER — Ambulatory Visit: Payer: 59 | Admitting: Physician Assistant

## 2019-11-04 ENCOUNTER — Encounter (INDEPENDENT_AMBULATORY_CARE_PROVIDER_SITE_OTHER): Payer: Self-pay

## 2019-11-17 ENCOUNTER — Ambulatory Visit: Payer: 59 | Admitting: Physician Assistant

## 2019-12-02 ENCOUNTER — Encounter: Payer: Self-pay | Admitting: Nurse Practitioner

## 2019-12-02 ENCOUNTER — Other Ambulatory Visit: Payer: Self-pay | Admitting: Nurse Practitioner

## 2019-12-02 ENCOUNTER — Ambulatory Visit (INDEPENDENT_AMBULATORY_CARE_PROVIDER_SITE_OTHER): Payer: 59 | Admitting: Nurse Practitioner

## 2019-12-02 ENCOUNTER — Other Ambulatory Visit: Payer: Self-pay

## 2019-12-02 VITALS — BP 116/74 | HR 67 | Temp 97.6°F | Ht 63.4 in | Wt 177.8 lb

## 2019-12-02 DIAGNOSIS — N926 Irregular menstruation, unspecified: Secondary | ICD-10-CM | POA: Diagnosis not present

## 2019-12-02 DIAGNOSIS — R21 Rash and other nonspecific skin eruption: Secondary | ICD-10-CM | POA: Diagnosis not present

## 2019-12-02 DIAGNOSIS — Z833 Family history of diabetes mellitus: Secondary | ICD-10-CM | POA: Diagnosis not present

## 2019-12-02 NOTE — Progress Notes (Signed)
This visit occurred during the SARS-CoV-2 public health emergency.  Safety protocols were in place, including screening questions prior to the visit, additional usage of staff PPE, and extensive cleaning of exam room while observing appropriate contact time as indicated for disinfecting solutions.  Subjective:     Patient ID: Misty Choi , female    DOB: [birth year] , 49 y.o.   MRN: [id number]   Chief Complaint  Patient presents with  . Rash    HPI  Patient went to urgent care and was given a cream and the cleared it up but it came back. Patient declines having rash before wearing mask. Patient feels the rash may be caused by stress. Rash initially started after receiving covid vaccine. Has not had menstrual cycle since June 9th, she is due for a pap.  Rash This is a chronic problem. The current episode started more than 1 month ago. The problem is unchanged. The affected locations include the face. The rash is characterized by redness and itchiness. She was exposed to nothing. Pertinent negatives include no fatigue or fever. Past treatments include topical steroids.     Past Medical History:  Diagnosis Date  . Allergy      Family History  Problem Relation Age of Onset  . Diabetes Mother   . Heart disease Mother   . Hyperlipidemia Mother   . Hypertension Mother   . Diabetes Father   . Heart disease Father   . Hyperlipidemia Father   . Hypertension Father   . Stroke Father   . Diabetes Maternal Grandmother   . Heart disease Maternal Grandmother   . Stroke Maternal Grandmother   . Diabetes Paternal Grandmother   . Hypertension Paternal Grandmother   . Breast cancer Neg Hx      Current Outpatient Medications:  .  cyclobenzaprine (FLEXERIL) 5 MG tablet, Take 1 tablet (5 mg total) by mouth 3 (three) times daily as needed for muscle spasms. Take 2 tabs po qhs to prevent muscle spasms, Disp: 30 tablet, Rfl: 5 .  meloxicam (MOBIC) 15 MG tablet, TAKE 1 TABLET (15 MG  TOTAL) BY MOUTH DAILY. AS NEEDED FOR INFLAMMATION AND PAIN, Disp: 90 tablet, Rfl: 0 .  diphenhydrAMINE (BENADRYL) 25 mg capsule, Take 25 mg by mouth every 6 (six) hours as needed. (Patient not taking: Reported on 12/02/2019), Disp: , Rfl:    Allergies  Allergen Reactions  . Pumpkin Flavor     Mild allergy  . Shrimp [Shellfish Allergy]     Mild allergy  . Azithromycin Rash  . Nitrofurantoin Monohyd Macro Rash     Review of Systems  Constitutional: Negative for fatigue and fever.  Respiratory: Negative.   Cardiovascular: Negative.  Negative for chest pain, palpitations and leg swelling.  Endocrine: Negative for polydipsia, polyphagia and polyuria.  Skin: Positive for color change (grey) and rash (redness on face and neck).  Neurological: Negative for dizziness and headaches.  Psychiatric/Behavioral: Negative.      Today's Vitals   12/02/19 1108  BP: 116/74  Pulse: 67  Temp: 97.6 F (36.4 C)  TempSrc: Oral  Weight: 177 lb 12.8 oz (80.6 kg)  Height: 5' 3.4" (1.61 m)  PainSc: 0-No pain   Body mass index is 31.1 kg/m.   Objective:  Physical Exam Constitutional:      General: She is not in acute distress.    Appearance: Normal appearance. She is obese.  Cardiovascular:     Rate and Rhythm: Normal rate and regular rhythm.  Pulses: Normal pulses.     Heart sounds: Normal heart sounds.  Pulmonary:     Effort: Pulmonary effort is normal.     Breath sounds: Normal breath sounds.  Skin:    General: Skin is warm and dry.     Findings: Rash (slight erythematous rash to face on cheeks) present.     Comments: Hyperpigmented skin to her left upper chest, left arm and anterior neck.  Neurological:     General: No focal deficit present.     Mental Status: She is alert and oriented to person, place, and time.  Psychiatric:        Mood and Affect: Mood normal.        Behavior: Behavior normal.        Thought Content: Thought content normal.        Judgment: Judgment normal.          Assessment And Plan:     Problem List Items Addressed This Visit    None    Visit Diagnoses    Rash of face    -  Primary   - has mild erythematous rash to face - she has an appt with Dermatology on 8/4 - will check lupus analysis test that was ordered by previous provider   Relevant Orders   Lupus (SLE) Analysis   Abnormal menses       - missed menstrual in June - will check LSH/FH - discussed possibility of being perimenopausal    Relevant Orders   FSH/LH   Family history of diabetes mellitus (DM)       Relevant Orders   Hemoglobin A1c        Patient was given opportunity to ask questions. Patient verbalized understanding of the plan and was able to repeat key elements of the plan. All questions were answered to their satisfaction.  Arnette Felts, FNP   I, Arnette Felts, FNP, have reviewed all documentation for this visit. The documentation on 12/02/19 for the exam, diagnosis, procedures, and orders are all accurate and complete.  THE PATIENT IS ENCOURAGED TO PRACTICE SOCIAL DISTANCING DUE TO THE COVID-19 PANDEMIC.

## 2019-12-03 LAB — HEMOGLOBIN A1C
Est. average glucose Bld gHb Est-mCnc: 108 mg/dL
Hgb A1c MFr Bld: 5.4 % (ref 4.8–5.6)

## 2019-12-03 LAB — LUPUS (SLE) ANALYSIS
Anti Nuclear Antibody (ANA): NEGATIVE
Anti-striation Abs: NEGATIVE
Complement C4, Serum: 24 mg/dL (ref 12–38)
ENA RNP Ab: 0.2 AI (ref 0.0–0.9)
ENA SM Ab Ser-aCnc: <0.2 AI (ref 0.0–0.9)
ENA SSA (RO) Ab: <0.2 AI (ref 0.0–0.9)
ENA SSB (LA) Ab: <0.2 AI (ref 0.0–0.9)
Mitochondrial Ab: <20 Units (ref 0.0–20.0)
Parietal Cell Ab: 4.1 Units (ref 0.0–20.0)
Scleroderma (Scl-70) (ENA) Antibody, IgG: <0.2 AI (ref 0.0–0.9)
Smooth Muscle Ab: 8 Units (ref 0–19)
Thyroperoxidase Ab SerPl-aCnc: <8 IU/mL (ref 0–34)
dsDNA Ab: <1 IU/mL (ref 0–9)

## 2019-12-03 LAB — FSH/LH
FSH: 4.3 m[IU]/mL
LH: 6.5 m[IU]/mL

## 2019-12-07 LAB — ESTRADIOL: Estradiol: 205 pg/mL

## 2019-12-07 LAB — SPECIMEN STATUS REPORT

## 2019-12-23 ENCOUNTER — Ambulatory Visit: Payer: 59 | Admitting: Physician Assistant

## 2019-12-23 ENCOUNTER — Encounter: Payer: Self-pay | Admitting: Physician Assistant

## 2019-12-23 ENCOUNTER — Other Ambulatory Visit: Payer: Self-pay

## 2019-12-23 DIAGNOSIS — L814 Other melanin hyperpigmentation: Secondary | ICD-10-CM

## 2019-12-23 DIAGNOSIS — L719 Rosacea, unspecified: Secondary | ICD-10-CM | POA: Diagnosis not present

## 2019-12-23 IMAGING — MG DIGITAL DIAGNOSTIC UNILATERAL LEFT MAMMOGRAM WITH TOMO AND CAD
8 series · 8 of 24 positions shown · non-contrast
Comparison: Previous exam(s).

CLINICAL DATA: Follow-up left breast asymmetry

EXAM:
DIGITAL DIAGNOSTIC UNILATERAL LEFT MAMMOGRAM WITH CAD AND TOMO

[L CC synth-2D (1 of 2)]
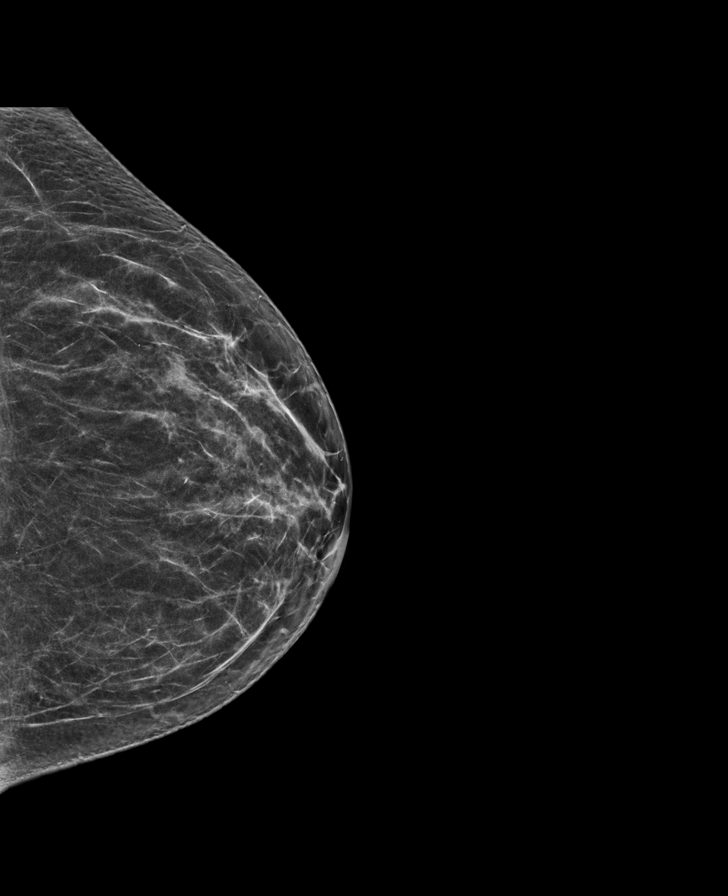

[L MLO synth-2D (1 of 2)]
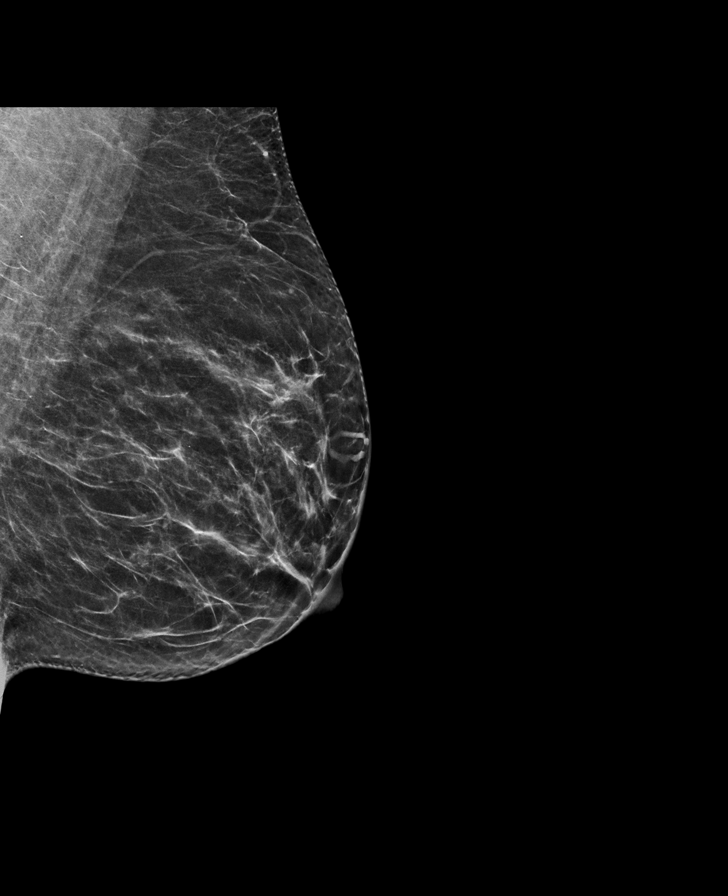

[L CC synth-2D (2 of 2)]
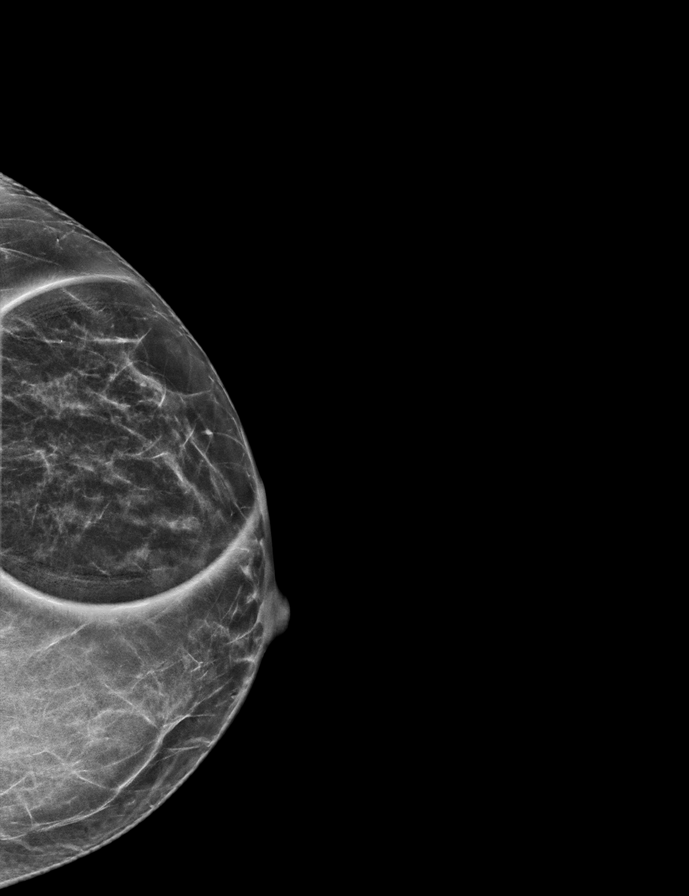

[L MLO synth-2D (2 of 2)]
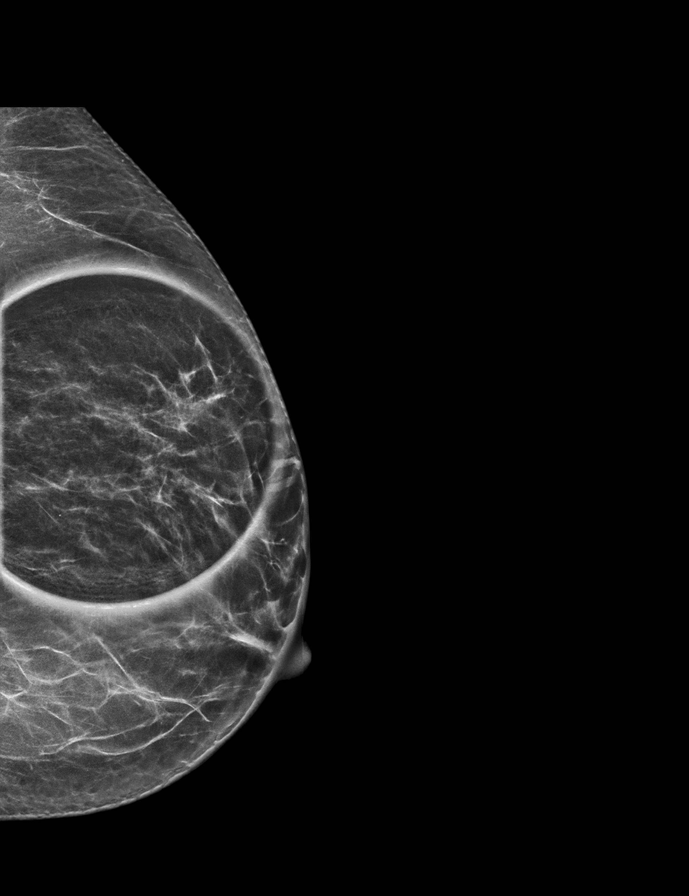

[L CC tomo (1 of 2) · tomo slice 31/60.0]
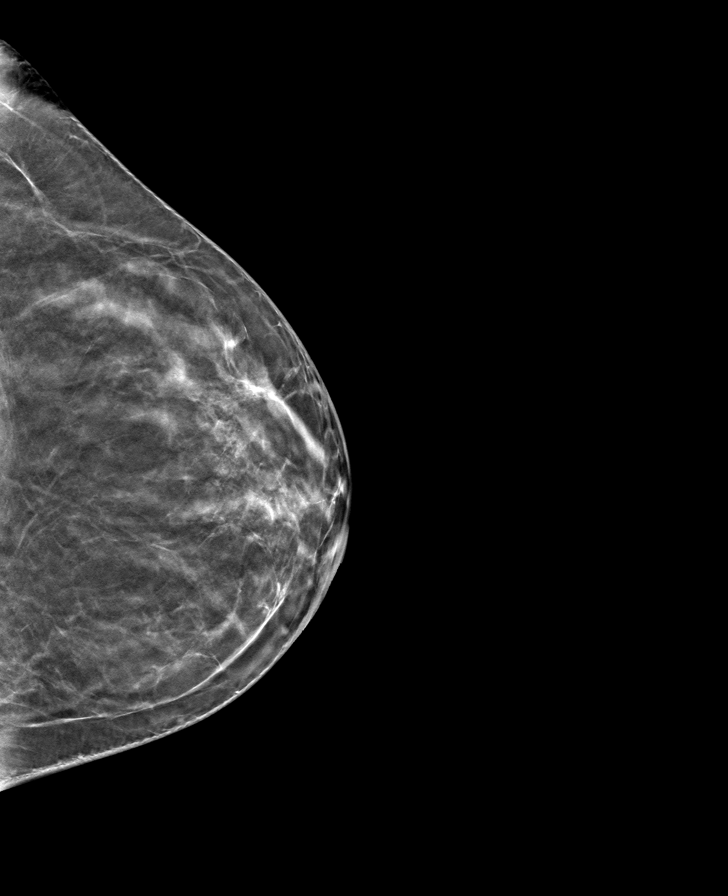

[L CC tomo (2 of 2) · tomo slice 23/46.0]
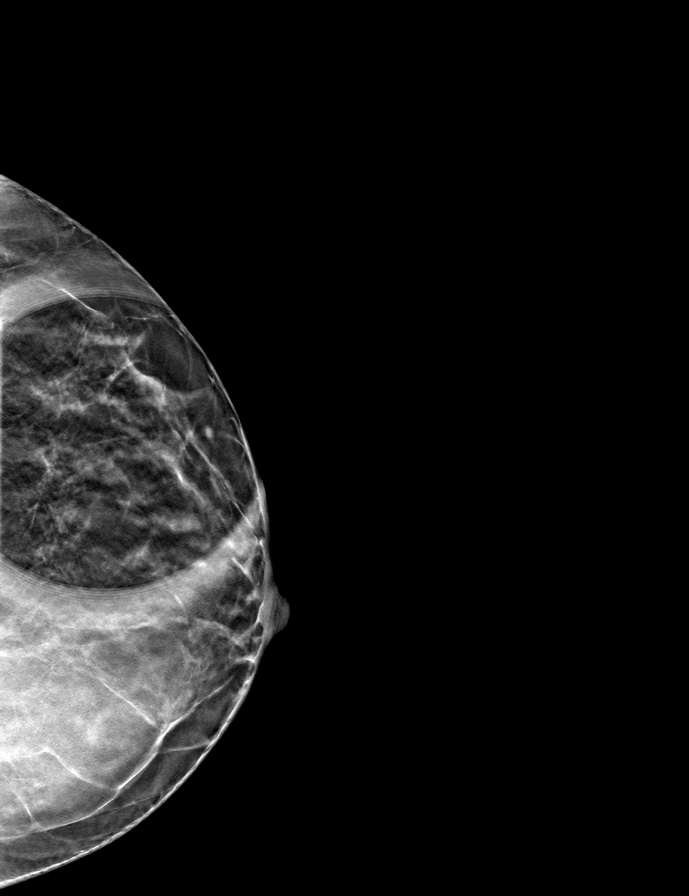

[L MLO tomo (1 of 2) · tomo slice 33/64.0]
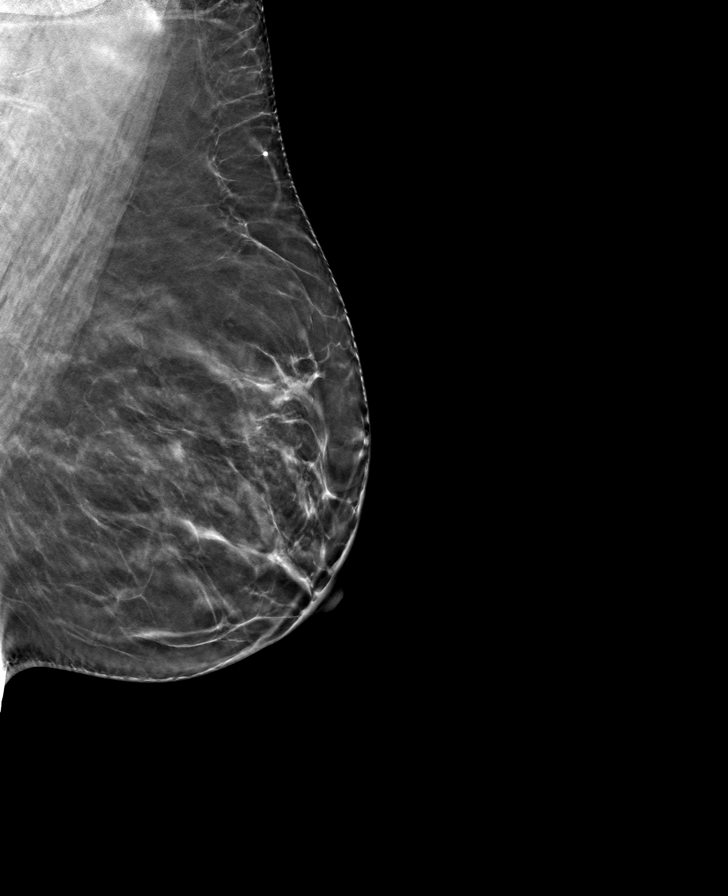

[L MLO tomo (2 of 2) · tomo slice 29/57.0]
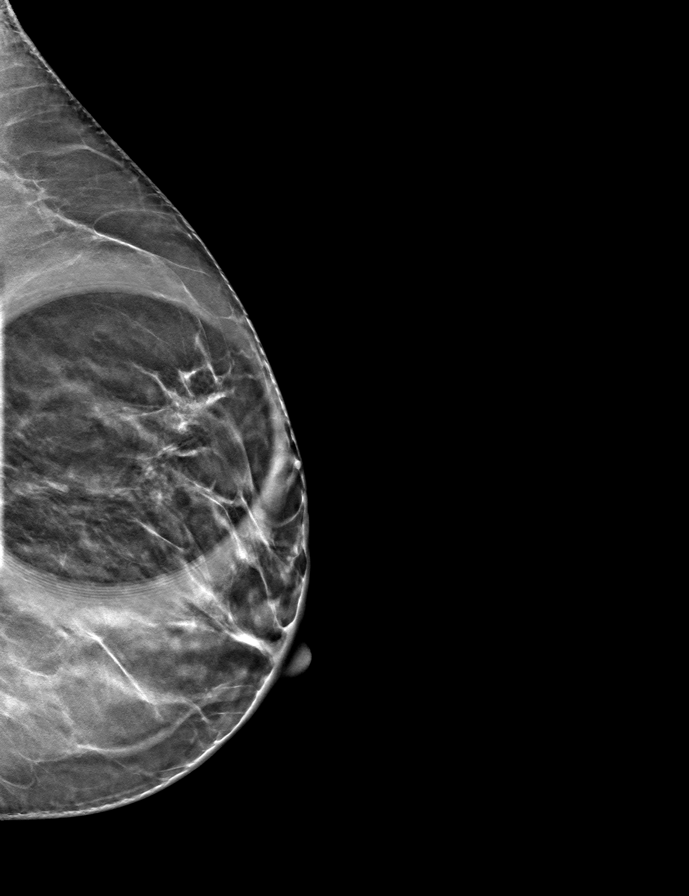

[8 of 24 positions shown; findings below may reference images not displayed]

ACR Breast Density Category b: There are scattered areas of
fibroglandular density.
FINDINGS: The left breast asymmetry is stable.  No other suspicious findings.

Mammographic images were processed with CAD.
IMPRESSION: Stable probably benign left breast asymmetry.

RECOMMENDATION:
Six-month follow-up mammography of the left breast asymmetry. The
patient will be due for bilateral mammography at that time.

I have discussed the findings and recommendations with the patient.
Results were also provided in writing at the conclusion of the
visit. If applicable, a reminder letter will be sent to the patient
regarding the next appointment.

BI-RADS CATEGORY  3: Probably benign.

## 2019-12-23 MED ORDER — METRONIDAZOLE 0.75 % EX CREA: TOPICAL_CREAM | Freq: Two times a day (BID) | CUTANEOUS | 2 refills | Status: DC

## 2019-12-23 NOTE — Progress Notes (Signed)
   New Patient Visit  Subjective  Misty Choi is a 49 y.o. female who presents for the following: Skin Problem (Skin on arms, around neck and under breast are darker in color. Also patients face gets red at times and warm to touch. Possible roascea? Also the redness under lip does it so Patient has tried cortisone cream and it does help. ). She also gets redness across cheeks and nose for the past 12 years. It gets red and feels warm and dry and occasional pimple like bumps. PCP did labs for lupus that were negative.  It comes and goes on its own. She has never treated it. It will be very red for a few days and then two days could be totally clear. She also has a dark discoloration of her skin on the tops of her arms and on her neck and chest. She also has it under her breasts in areas that have not seen sun. She also feels it is very dry and sometimes has a grey color. She seems to be perimenopausal as she has started skipping periods.   Objective  Well appearing patient in no apparent distress; mood and affect are within normal limits.  All skin waist up examined not including scalp.  Objective  Head - Anterior (Face): Centrifacial erythema with or without papules/pustules.   Objective  Left Abdomen (side) - Upper, Left Forearm - Posterior, Neck - Anterior, Right Abdomen (side) - Upper, Right Forearm - Posterior: Hyperpigmentation noted on abdomen, neck and arms. After review with Dr. 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  Assessment & Plan  Rosacea Head - Anterior (Face)  metroNIDAZOLE (METROCREAM) 0.75 % cream - Head - Anterior (Face)  Other melanin hyperpigmentation (5) Neck - Anterior; Left Forearm - Posterior; Right Forearm - Posterior; Left Abdomen (side) - Upper; Right Abdomen (side) - Upper  No particular treatment is needed for this. Biopsy was offered but may not confirm diagnosis. She will think about this for next visit.

## 2020-01-12 ENCOUNTER — Encounter: Payer: Self-pay | Admitting: Nurse Practitioner

## 2020-01-19 ENCOUNTER — Other Ambulatory Visit: Payer: Self-pay

## 2020-01-19 ENCOUNTER — Encounter: Payer: Self-pay | Admitting: Nurse Practitioner

## 2020-01-19 ENCOUNTER — Ambulatory Visit: Payer: 59 | Admitting: Nurse Practitioner

## 2020-01-19 VITALS — BP 122/70 | HR 78 | Temp 98.2°F | Ht 62.2 in | Wt 180.8 lb

## 2020-01-19 DIAGNOSIS — N926 Irregular menstruation, unspecified: Secondary | ICD-10-CM | POA: Diagnosis not present

## 2020-01-19 DIAGNOSIS — Z6832 Body mass index (BMI) 32.0-32.9, adult: Secondary | ICD-10-CM | POA: Diagnosis not present

## 2020-01-19 NOTE — Progress Notes (Signed)
I,Yamilka Roman [employer] as a Neurosurgeon for [employer], FNP.,have documented all relevant documentation on the behalf of Arnette Felts, FNP,as directed by  Arnette Felts, FNP while in the presence of Arnette Felts, FNP. This visit occurred during the SARS-CoV-2 public health emergency.  Safety protocols were in place, including screening questions prior to the visit, additional usage of staff PPE, and extensive cleaning of exam room while observing appropriate contact time as indicated for disinfecting solutions.  Subjective:     Patient ID: Misty Choi , female    DOB: 02/05/1971 , 49 y.o.   MRN: [id number]   Chief Complaint  Patient presents with  . Referral    patient would like a referral to a GYN because she is having issues with her menstrual cycle.     HPI  Patient stated she would like a referral to a GYN her period was really heavy and bright red blood. She had a menstrual cycle for the last 3 weeks.  She reports the blood was bright red blood from her vaginal area. She went about 40 days without a menstrual then it lasted for about 1 week and then had no period for   June 9th had menstrual cycle the next was on July 28th for a week then again on Aug 8th for 3 weeks.  Denies having any pain, did not have bloating or cramps.   She has been working more hours with work and the transition. She has been trying to eat healthy with the blood loss    Past Medical History:  Diagnosis Date  . Allergy      Family History  Problem Relation Age of Onset  . Diabetes Mother   . Heart disease Mother   . Hyperlipidemia Mother   . Hypertension Mother   . Diabetes Father   . Heart disease Father   . Hyperlipidemia Father   . Hypertension Father   . Stroke Father   . Diabetes Maternal Grandmother   . Heart disease Maternal Grandmother   . Stroke Maternal Grandmother   . Diabetes Paternal Grandmother   . Hypertension Paternal Grandmother   . Breast cancer Neg Hx       Current Outpatient Medications:  .  cyclobenzaprine (FLEXERIL) 5 MG tablet, Take 1 tablet (5 mg total) by mouth 3 (three) times daily as needed for muscle spasms. Take 2 tabs po qhs to prevent muscle spasms, Disp: 30 tablet, Rfl: 5 .  diphenhydrAMINE (BENADRYL) 25 mg capsule, Take 25 mg by mouth every 6 (six) hours as needed. , Disp: , Rfl:  .  meloxicam (MOBIC) 15 MG tablet, TAKE 1 TABLET (15 MG TOTAL) BY MOUTH DAILY. AS NEEDED FOR INFLAMMATION AND PAIN, Disp: 90 tablet, Rfl: 0 .  metroNIDAZOLE (METROCREAM) 0.75 % cream, Apply topically in the morning and at bedtime., Disp: 45 g, Rfl: 2   Allergies  Allergen Reactions  . Pumpkin Flavor     Mild allergy  . Shrimp [Shellfish Allergy]     Mild allergy  . Azithromycin Rash  . Nitrofurantoin Monohyd Macro Rash     Review of Systems  Constitutional: Negative.   Cardiovascular: Negative.  Negative for chest pain, palpitations and leg swelling.  Gastrointestinal: Negative.   Musculoskeletal: Negative.   Skin: Negative.   Psychiatric/Behavioral: Negative.      Today's Vitals   01/19/20 1447  BP: 122/70  Pulse: 78  Temp: 98.2 F (36.8 C)  TempSrc: Oral  Weight: 180 lb 12.8 oz (82 kg)  Height: 5' 2.2" (1.58 m)  PainSc: 0-No pain   Body mass index is 32.86 kg/m.   Objective:  Physical Exam Vitals reviewed.  Constitutional:      General: She is not in acute distress.    Appearance: Normal appearance. She is obese.  Cardiovascular:     Rate and Rhythm: Normal rate and regular rhythm.     Pulses: Normal pulses.     Heart sounds: Normal heart sounds. No murmur heard.   Pulmonary:     Effort: Pulmonary effort is normal. No respiratory distress.     Breath sounds: Normal breath sounds.  Neurological:     General: No focal deficit present.     Mental Status: She is alert and oriented to person, place, and time.     Cranial Nerves: No cranial nerve deficit.  Psychiatric:        Mood and Affect: Mood normal.         Behavior: Behavior normal.        Thought Content: Thought content normal.        Judgment: Judgment normal.         Assessment And Plan:     1. Irregular menses  She has had a long period lasting 3 weeks at this time has resolved  Will refer to GYN - Ambulatory referral to Obstetrics / Gynecology  2. BMI 32.0-32.9,adult   She is encouraged to strive for BMI less than 30 to decrease cardiac risk. Advised to aim for at least 150 minutes of exercise per week.    Patient was given opportunity to ask questions. Patient verbalized understanding of the plan and was able to repeat key elements of the plan. All questions were answered to their satisfaction.  Arnette Felts, FNP   I, Arnette Felts, FNP, have reviewed all documentation for this visit. The documentation on 01/19/20 for the exam, diagnosis, procedures, and orders are all accurate and complete.  THE PATIENT IS ENCOURAGED TO PRACTICE SOCIAL DISTANCING DUE TO THE COVID-19 PANDEMIC.

## 2020-03-08 ENCOUNTER — Ambulatory Visit: Payer: 59 | Admitting: Physician Assistant

## 2020-03-15 ENCOUNTER — Encounter: Payer: 59 | Admitting: Nurse Practitioner

## 2020-04-19 ENCOUNTER — Other Ambulatory Visit: Payer: Self-pay

## 2020-04-19 ENCOUNTER — Encounter: Payer: Self-pay | Admitting: Dermatology

## 2020-04-19 ENCOUNTER — Ambulatory Visit: Payer: 59 | Admitting: Dermatology

## 2020-04-19 DIAGNOSIS — L719 Rosacea, unspecified: Secondary | ICD-10-CM

## 2020-04-23 ENCOUNTER — Encounter: Payer: Self-pay | Admitting: Dermatology

## 2020-04-23 NOTE — Progress Notes (Signed)
   Follow-Up Visit   Subjective  Misty Choi is a 49 y.o. female who presents for the following: Rosacea (follow up- metronidazole cream- much better).  Rosacea Location: Central face Duration:  Quality: Improved Associated Signs/Symptoms: Modifying Factors: Topical metronidazole Severity:  Timing: Context:   Objective  Well appearing patient in no apparent distress; mood and affect are within normal limits.  A focused examination was performed including Head and neck. Relevant physical exam findings are noted in the Assessment and Plan.   Assessment & Plan    Rosacea Head - Anterior (Face)  Reviewed the chronic nature of rosacea including the potential role of triggers.  She may certainly continue using the metronidazole but if she is virtually clear, she can try to taper the frequency of application.  Follow-up can be on a as needed basis.  metroNIDAZOLE (METROCREAM) 0.75 % cream - Head - Anterior (Face)     I, Janalyn Harder, MD, have reviewed all documentation for this visit.  The documentation on 04/23/20 for the exam, diagnosis, procedures, and orders are all accurate and complete.

## 2020-04-25 ENCOUNTER — Other Ambulatory Visit: Payer: Self-pay | Admitting: Obstetrics and Gynecology

## 2020-08-07 ENCOUNTER — Encounter: Payer: Self-pay | Admitting: Nurse Practitioner

## 2020-09-04 ENCOUNTER — Other Ambulatory Visit: Payer: 59

## 2020-09-04 ENCOUNTER — Other Ambulatory Visit: Payer: Self-pay

## 2020-09-04 DIAGNOSIS — Z0000 Encounter for general adult medical examination without abnormal findings: Secondary | ICD-10-CM

## 2020-09-04 DIAGNOSIS — E781 Pure hyperglyceridemia: Secondary | ICD-10-CM

## 2020-09-05 LAB — LIPID PANEL
Chol/HDL Ratio: 5.1 ratio — ABNORMAL HIGH (ref 0.0–4.4)
Cholesterol, Total: 213 mg/dL — ABNORMAL HIGH (ref 100–199)
HDL: 42 mg/dL (ref >39)
LDL Chol Calc (NIH): 126 mg/dL — ABNORMAL HIGH (ref 0–99)
Triglycerides: 254 mg/dL — ABNORMAL HIGH (ref 0–149)
VLDL Cholesterol Cal: 45 mg/dL — ABNORMAL HIGH (ref 5–40)

## 2020-09-06 ENCOUNTER — Ambulatory Visit (INDEPENDENT_AMBULATORY_CARE_PROVIDER_SITE_OTHER): Payer: 59 | Admitting: Nurse Practitioner

## 2020-09-06 ENCOUNTER — Encounter: Payer: Self-pay | Admitting: Nurse Practitioner

## 2020-09-06 ENCOUNTER — Other Ambulatory Visit: Payer: Self-pay

## 2020-09-06 VITALS — BP 114/68 | HR 68 | Temp 98.2°F | Ht 62.4 in | Wt 179.0 lb

## 2020-09-06 DIAGNOSIS — Z1159 Encounter for screening for other viral diseases: Secondary | ICD-10-CM | POA: Diagnosis not present

## 2020-09-06 DIAGNOSIS — Z0000 Encounter for general adult medical examination without abnormal findings: Secondary | ICD-10-CM

## 2020-09-06 DIAGNOSIS — Z1211 Encounter for screening for malignant neoplasm of colon: Secondary | ICD-10-CM

## 2020-09-06 DIAGNOSIS — E782 Mixed hyperlipidemia: Secondary | ICD-10-CM | POA: Diagnosis not present

## 2020-09-06 NOTE — Progress Notes (Signed)
Rutherford Nail as a scribe for Minette Brine, FNP.,have documented all relevant documentation on the behalf of Minette Brine, FNP,as directed by  Minette Brine, FNP while in the presence of Minette Brine, [place]. This visit occurred during the SARS-CoV-2 public health emergency.  Safety protocols were in place, including screening questions prior to the visit, additional usage of staff PPE, and extensive cleaning of exam room while observing appropriate contact time as indicated for disinfecting solutions.  Subjective:     Patient ID: Misty Choi , female    DOB: 14-Sep-1970 , 50 y.o.   MRN: [id number]   Chief Complaint  Patient presents with  . Annual Exam    HPI  Pt is here today for HM exam she currently see's Dr. Charlesetta Garibaldi for her OB-GYN was last seen December 2021 she is compliant with all medications and does not have any complants at this time. She is working 2 jobs and overall doing well.   Wt Readings from Last 3 Encounters: 09/06/20 : 179 lb (81.2 kg) 01/19/20 : 180 lb 12.8 oz (82 kg) 12/02/19 : 177 lb 12.8 oz (80.6 kg)     Past Medical History:  Diagnosis Date  . Allergy      Family History  Problem Relation Age of Onset  . Diabetes Mother   . Heart disease Mother   . Hyperlipidemia Mother   . Hypertension Mother   . Hyperparathyroidism Mother   . Diabetes Father   . Heart disease Father   . Hyperlipidemia Father   . Hypertension Father   . Stroke Father   . Diabetes Maternal Grandmother   . Heart disease Maternal Grandmother   . Stroke Maternal Grandmother   . Diabetes Paternal Grandmother   . Hypertension Paternal Grandmother   . Breast cancer Neg Hx      Current Outpatient Medications:  .  meloxicam (MOBIC) 15 MG tablet, TAKE 1 TABLET (15 MG TOTAL) BY MOUTH DAILY. AS NEEDED FOR INFLAMMATION AND PAIN, Disp: 90 tablet, Rfl: 0 .  cyclobenzaprine (FLEXERIL) 5 MG tablet, Take 1 tablet (5 mg total) by mouth 3 (three) times daily as needed for  muscle spasms. Take 2 tabs po qhs to prevent muscle spasms (Patient not taking: Reported on 09/06/2020), Disp: 30 tablet, Rfl: 5 .  diphenhydrAMINE (BENADRYL) 25 mg capsule, Take 25 mg by mouth every 6 (six) hours as needed.  (Patient not taking: Reported on 09/06/2020), Disp: , Rfl:    Allergies  Allergen Reactions  . Pumpkin Flavor     Mild allergy  . Shrimp [Shellfish Allergy]     Mild allergy  . Azithromycin Rash  . Nitrofurantoin Monohyd Macro Rash      The patient states she uses none for birth control. Last LMP was Patient's last menstrual period was 08/03/2020.. Negative for Dysmenorrhea and Negative for Menorrhagia. Negative for: breast discharge, breast lump(s), breast pain and breast self exam. Associated symptoms include abnormal vaginal bleeding. Pertinent negatives include abnormal bleeding (hematology), anxiety, decreased libido, depression, difficulty falling sleep, dyspareunia, history of infertility, nocturia, sexual dysfunction, sleep disturbances, urinary incontinence, urinary urgency, vaginal discharge and vaginal itching. Diet regular. The patient states her exercise level is minimal will do Zumba once a week and walks with her job.    The patient's tobacco use is:  Social History   Tobacco Use  Smoking Status Never Smoker  Smokeless Tobacco Never Used   She has been exposed to passive smoke. The patient's alcohol use is:  Social History  Substance and Sexual Activity  Alcohol Use Yes   Additional information: Last pap 03/28/2020, next one scheduled for 03/29/2023  Review of Systems  Constitutional: Negative.  Negative for fatigue.  HENT: Negative.   Eyes: Negative.   Respiratory: Negative.   Cardiovascular: Negative.   Gastrointestinal: Negative.   Endocrine: Negative.  Negative for polydipsia, polyphagia and polyuria.  Genitourinary: Negative.   Musculoskeletal: Negative.   Skin: Negative.   Allergic/Immunologic: Negative.   Neurological: Negative.   Negative for dizziness and headaches.  Hematological: Negative.   Psychiatric/Behavioral: Negative.      Today's Vitals   09/06/20 1441  BP: 114/68  Pulse: 68  Temp: 98.2 F (36.8 C)  TempSrc: Oral  Weight: 179 lb (81.2 kg)  Height: 5' 2.4" (1.585 m)   Body mass index is 32.32 kg/m.  Wt Readings from Last 3 Encounters:  09/06/20 179 lb (81.2 kg)  01/19/20 180 lb 12.8 oz (82 kg)  12/02/19 177 lb 12.8 oz (80.6 kg)   Objective:  Physical Exam Vitals reviewed.  Constitutional:      General: She is not in acute distress.    Appearance: Normal appearance. She is well-developed. She is obese.  HENT:     Head: Normocephalic and atraumatic.     Right Ear: Hearing, tympanic membrane, ear canal and external ear normal. There is no impacted cerumen.     Left Ear: Hearing, tympanic membrane, ear canal and external ear normal. There is no impacted cerumen.     Nose:     Comments: Deferred - masked    Mouth/Throat:     Comments: Deferred - masked Eyes:     General: Lids are normal.     Extraocular Movements: Extraocular movements intact.     Conjunctiva/sclera: Conjunctivae normal.     Pupils: Pupils are equal, round, and reactive to light.     Funduscopic exam:    Right eye: No papilledema.        Left eye: No papilledema.  Neck:     Thyroid: No thyroid mass.     Vascular: No carotid bruit.  Cardiovascular:     Rate and Rhythm: Normal rate and regular rhythm.     Pulses: Normal pulses.     Heart sounds: Normal heart sounds. No murmur heard.   Pulmonary:     Effort: Pulmonary effort is normal.     Breath sounds: Normal breath sounds.  Chest:     Chest wall: No mass.  Breasts:     Tanner Score is 5.     Right: Normal. No mass, tenderness, axillary adenopathy or supraclavicular adenopathy.     Left: Normal. No mass, tenderness, axillary adenopathy or supraclavicular adenopathy.    Abdominal:     General: Abdomen is flat. Bowel sounds are normal. There is no distension.      Palpations: Abdomen is soft.     Tenderness: There is no abdominal tenderness.  Genitourinary:    Rectum: Guaiac result negative.  Musculoskeletal:        General: No swelling. Normal range of motion.     Cervical back: Full passive range of motion without pain, normal range of motion and neck supple.     Right lower leg: No edema.     Left lower leg: No edema.  Lymphadenopathy:     Upper Body:     Right upper body: No supraclavicular, axillary or pectoral adenopathy.     Left upper body: No supraclavicular, axillary or pectoral adenopathy.  Skin:  General: Skin is warm and dry.     Capillary Refill: Capillary refill takes less than 2 seconds.  Neurological:     General: No focal deficit present.     Mental Status: She is alert and oriented to person, place, and time.     Cranial Nerves: No cranial nerve deficit.     Sensory: No sensory deficit.  Psychiatric:        Mood and Affect: Mood normal.        Behavior: Behavior normal.        Thought Content: Thought content normal.        Judgment: Judgment normal.         Assessment And Plan:     1. Encounter for general adult medical examination w/o abnormal findings . Behavior modifications discussed and diet history reviewed.   . Pt will continue to exercise regularly and modify diet with low GI, plant based foods and decrease intake of processed foods.  . Recommend intake of daily multivitamin, Vitamin D, and calcium.  . Recommend mammogram and colonoscopy (order placed) for preventive screenings, as well as recommend immunizations that include influenza, TDAP (up to date) - BMP8+eGFR - CBC  2. Encounter for hepatitis C screening test for low risk patient  Will check Hepatitis C screening due to recent recommendations to screen all adults 18 years and older - Hepatitis C antibody  3. Encounter for screening colonoscopy  According to USPTF Colorectal cancer Screening guidelines. Colonoscopy is recommended every 10  years, starting at age 78 years.  Will refer to GI for colon cancer screening. - Ambulatory referral to Gastroenterology  4. Mixed hyperlipidemia  Her cholesterol levels are slightly more elevated, she realizes she has been eating more fast food and unhealthy would like to try diet and exercise. She is encouraged to limit intake of fried and fatty foods     Patient was given opportunity to ask questions. Patient verbalized understanding of the plan and was able to repeat key elements of the plan. All questions were answered to their satisfaction.   Minette Brine, FNP   I, Minette Brine, FNP, have reviewed all documentation for this visit. The documentation on 09/06/20 for the exam, diagnosis, procedures, and orders are all accurate and complete.   THE PATIENT IS ENCOURAGED TO PRACTICE SOCIAL DISTANCING DUE TO THE COVID-19 PANDEMIC.

## 2020-09-06 NOTE — Patient Instructions (Signed)
Health Maintenance, Female Adopting a healthy lifestyle and getting preventive care are important in promoting health and wellness. Ask your health care provider about:  The right schedule for you to have regular tests and exams.  Things you can do on your own to prevent diseases and keep yourself healthy. What should I know about diet, weight, and exercise? Eat a healthy diet  Eat a diet that includes plenty of vegetables, fruits, low-fat dairy products, and lean protein.  Do not eat a lot of foods that are high in solid fats, added sugars, or sodium.   Maintain a healthy weight Body mass index (BMI) is used to identify weight problems. It estimates body fat based on height and weight. Your health care provider can help determine your BMI and help you achieve or maintain a healthy weight. Get regular exercise Get regular exercise. This is one of the most important things you can do for your health. Most adults should:  Exercise for at least 150 minutes each week. The exercise should increase your heart rate and make you sweat (moderate-intensity exercise).  Do strengthening exercises at least twice a week. This is in addition to the moderate-intensity exercise.  Spend less time sitting. Even light physical activity can be beneficial. Watch cholesterol and blood lipids Have your blood tested for lipids and cholesterol at 51 years of age, then have this test every 5 years. Have your cholesterol levels checked more often if:  Your lipid or cholesterol levels are high.  You are older than 51 years of age.  You are at high risk for heart disease. What should I know about cancer screening? Depending on your health history and family history, you may need to have cancer screening at various ages. This may include screening for:  Breast cancer.  Cervical cancer.  Colorectal cancer.  Skin cancer.  Lung cancer. What should I know about heart disease, diabetes, and high blood  pressure? Blood pressure and heart disease  High blood pressure causes heart disease and increases the risk of stroke. This is more likely to develop in people who have high blood pressure readings, are of African descent, or are overweight.  Have your blood pressure checked: ? Every 3-5 years if you are 18-39 years of age. ? Every year if you are 40 years old or older. Diabetes Have regular diabetes screenings. This checks your fasting blood sugar level. Have the screening done:  Once every three years after age 40 if you are at a normal weight and have a low risk for diabetes.  More often and at a younger age if you are overweight or have a high risk for diabetes. What should I know about preventing infection? Hepatitis B If you have a higher risk for hepatitis B, you should be screened for this virus. Talk with your health care provider to find out if you are at risk for hepatitis B infection. Hepatitis C Testing is recommended for:  Everyone born from 1945 through 1965.  Anyone with known risk factors for hepatitis C. Sexually transmitted infections (STIs)  Get screened for STIs, including gonorrhea and chlamydia, if: ? You are sexually active and are younger than 51 years of age. ? You are older than 51 years of age and your health care provider tells you that you are at risk for this type of infection. ? Your sexual activity has changed since you were last screened, and you are at increased risk for chlamydia or gonorrhea. Ask your health care provider   if you are at risk.  Ask your health care provider about whether you are at high risk for HIV. Your health care provider may recommend a prescription medicine to help prevent HIV infection. If you choose to take medicine to prevent HIV, you should first get tested for HIV. You should then be tested every 3 months for as long as you are taking the medicine. Pregnancy  If you are about to stop having your period (premenopausal) and  you may become pregnant, seek counseling before you get pregnant.  Take 400 to 800 micrograms (mcg) of folic acid every day if you become pregnant.  Ask for birth control (contraception) if you want to prevent pregnancy. Osteoporosis and menopause Osteoporosis is a disease in which the bones lose minerals and strength with aging. This can result in bone fractures. If you are 65 years old or older, or if you are at risk for osteoporosis and fractures, ask your health care provider if you should:  Be screened for bone loss.  Take a calcium or vitamin D supplement to lower your risk of fractures.  Be given hormone replacement therapy (HRT) to treat symptoms of menopause. Follow these instructions at home: Lifestyle  Do not use any products that contain nicotine or tobacco, such as cigarettes, e-cigarettes, and chewing tobacco. If you need help quitting, ask your health care provider.  Do not use street drugs.  Do not share needles.  Ask your health care provider for help if you need support or information about quitting drugs. Alcohol use  Do not drink alcohol if: ? Your health care provider tells you not to drink. ? You are pregnant, may be pregnant, or are planning to become pregnant.  If you drink alcohol: ? Limit how much you use to 0-1 drink a day. ? Limit intake if you are breastfeeding.  Be aware of how much alcohol is in your drink. In the [place] one drink equals one 12 oz bottle of beer (355 mL), one 5 oz glass of wine (148 mL), or one 1 oz glass of hard liquor (44 mL). General instructions  Schedule regular health, dental, and eye exams.  Stay current with your vaccines.  Tell your health care provider if: ? You often feel depressed. ? You have ever been abused or do not feel safe at home. Summary  Adopting a healthy lifestyle and getting preventive care are important in promoting health and wellness.  Follow your health care provider's instructions about healthy  diet, exercising, and getting tested or screened for diseases.  Follow your health care provider's instructions on monitoring your cholesterol and blood pressure. This information is not intended to replace advice given to you by your health care provider. Make sure you discuss any questions you have with your health care provider. Document Revised: 04/29/2018 Document Reviewed: 04/29/2018 Elsevier Patient Education  2021 [employer] Inc[employer]  

## 2020-09-10 ENCOUNTER — Encounter: Payer: Self-pay | Admitting: Nurse Practitioner

## 2020-09-20 ENCOUNTER — Telehealth: Payer: Self-pay

## 2020-09-20 NOTE — Telephone Encounter (Signed)
I called patient and left her a vm to call the office she will need to have a lab visit due additional labs not being able to be added. YL,RMA

## 2020-09-21 ENCOUNTER — Other Ambulatory Visit: Payer: 59

## 2020-09-21 ENCOUNTER — Other Ambulatory Visit: Payer: Self-pay

## 2020-09-22 LAB — CBC
Hematocrit: 38.6 % (ref 34.0–46.6)
Hemoglobin: 13 g/dL (ref 11.1–15.9)
MCH: 31.5 pg (ref 26.6–33.0)
MCHC: 33.7 g/dL (ref 31.5–35.7)
MCV: 94 fL (ref 79–97)
Platelets: 275 10*3/uL (ref 150–450)
RBC: 4.13 x10E6/uL (ref 3.77–5.28)
RDW: 12.6 % (ref 11.7–15.4)
WBC: 5.5 10*3/uL (ref 3.4–10.8)

## 2020-09-22 LAB — BMP8+EGFR
BUN/Creatinine Ratio: 16 (ref 9–23)
BUN: 13 mg/dL (ref 6–24)
CO2: 21 mmol/L (ref 20–29)
Calcium: 8.6 mg/dL — ABNORMAL LOW (ref 8.7–10.2)
Chloride: 104 mmol/L (ref 96–106)
Creatinine, Ser: 0.79 mg/dL (ref 0.57–1.00)
Glucose: 88 mg/dL (ref 65–99)
Potassium: 4.2 mmol/L (ref 3.5–5.2)
Sodium: 140 mmol/L (ref 134–144)
eGFR: 91 mL/min/{1.73_m2} (ref >59)

## 2020-09-22 LAB — HEPATITIS C ANTIBODY: Hep C Virus Ab: <0.1 s/co ratio (ref 0.0–0.9)

## 2021-03-15 ENCOUNTER — Encounter: Payer: Self-pay | Admitting: Nurse Practitioner

## 2021-03-15 ENCOUNTER — Other Ambulatory Visit: Payer: Self-pay

## 2021-03-15 ENCOUNTER — Ambulatory Visit: Payer: 59 | Admitting: Nurse Practitioner

## 2021-03-15 VITALS — BP 146/98 | HR 75 | Temp 99.3°F | Ht 62.4 in | Wt 179.6 lb

## 2021-03-15 DIAGNOSIS — R5383 Other fatigue: Secondary | ICD-10-CM | POA: Diagnosis not present

## 2021-03-15 DIAGNOSIS — R42 Dizziness and giddiness: Secondary | ICD-10-CM

## 2021-03-15 DIAGNOSIS — Z6832 Body mass index (BMI) 32.0-32.9, adult: Secondary | ICD-10-CM | POA: Diagnosis not present

## 2021-03-15 DIAGNOSIS — R519 Headache, unspecified: Secondary | ICD-10-CM | POA: Diagnosis not present

## 2021-03-15 NOTE — Progress Notes (Signed)
I,Katawbba Wiggins,acting as a scribe for Janece Moore, FNP.,have documented all relevant documentation on the behalf of Janece Moore, FNP,as directed by  Janece Moore, FNP while in the presence of Janece Moore, FNP.   This visit occurred during the SARS-CoV-2 public health emergency.  Safety protocols were in place, including screening questions prior to the visit, additional usage of staff PPE, and extensive cleaning of exam room while observing appropriate contact time as indicated for disinfecting solutions.  Subjective:     Patient ID: Misty Choi , female    DOB: 08/17/1970 , 50 y.o.   MRN: [id number]   Chief Complaint  Patient presents with   Fatigue    HPI  The patient is here today for evaluation of fatigue and pressure at her temples.  On Tuesday she had pressure to her head. She describes being stressed. 2 weeks ago she had muscle tension and took muscle relaxer. The patient also request a refill on her meloxicam. She is currently working 2 jobs.  Reports feel like her heart rate is going fast. She has taken Tylenol, she has had diarrhea yesterday. She is going to PT for her neck  She reports having anxiety in the past but does not want any medications due to a previous reaction.  Denies a previous history of blood pressure problems.    Past Medical History:  Diagnosis Date   Allergy      Family History  Problem Relation Age of Onset   Diabetes Mother    Heart disease Mother    Hyperlipidemia Mother    Hypertension Mother    Hyperparathyroidism Mother    Diabetes Father    Heart disease Father    Hyperlipidemia Father    Hypertension Father    Stroke Father    Diabetes Maternal Grandmother    Heart disease Maternal Grandmother    Stroke Maternal Grandmother    Diabetes Paternal Grandmother    Hypertension Paternal Grandmother    Breast cancer Neg Hx      Current Outpatient Medications:    diphenhydrAMINE (BENADRYL) 25 mg capsule, Take 25 mg by  mouth every 6 (six) hours as needed., Disp: , Rfl:    meloxicam (MOBIC) 15 MG tablet, TAKE 1 TABLET (15 MG TOTAL) BY MOUTH DAILY. AS NEEDED FOR INFLAMMATION AND PAIN (Patient not taking: Reported on 03/15/2021), Disp: 90 tablet, Rfl: 0   Allergies  Allergen Reactions   Pumpkin Flavor     Mild allergy   Shrimp [Shellfish Allergy]     Mild allergy   Azithromycin Rash   Nitrofurantoin Monohyd Macro Rash     Review of Systems  Constitutional:  Positive for fatigue.  HENT:         Pressure at temple areas  Respiratory: Negative.    Cardiovascular: Negative.  Negative for chest pain, palpitations and leg swelling.  Gastrointestinal: Negative.   Neurological:  Positive for dizziness. Negative for headaches.  Psychiatric/Behavioral: Negative.    All other systems reviewed and are negative.   Today's Vitals   03/15/21 1115  BP: (!) 146/98  Pulse: 75  Temp: 99.3 F (37.4 C)  Weight: 179 lb 9.6 oz (81.5 kg)  Height: 5' 2.4" (1.585 m)   Body mass index is 32.43 kg/m.  Wt Readings from Last 3 Encounters:  03/15/21 179 lb 9.6 oz (81.5 kg)  09/06/20 179 lb (81.2 kg)  01/19/20 180 lb 12.8 oz (82 kg)    BP Readings from Last 3 Encounters:  03/15/21 (!) 146/98    09/06/20 114/68  01/19/20 122/70    Objective:  Physical Exam Vitals reviewed.  Constitutional:      General: She is not in acute distress.    Appearance: Normal appearance. She is obese.  Cardiovascular:     Rate and Rhythm: Normal rate and regular rhythm.     Pulses: Normal pulses.     Heart sounds: Normal heart sounds. No murmur heard. Pulmonary:     Effort: Pulmonary effort is normal. No respiratory distress.     Breath sounds: Normal breath sounds. No wheezing.  Skin:    Capillary Refill: Capillary refill takes less than 2 seconds.  Neurological:     General: No focal deficit present.     Mental Status: She is alert and oriented to person, place, and time.     Cranial Nerves: No cranial nerve deficit.   Psychiatric:        Mood and Affect: Mood normal.        Behavior: Behavior normal.        Thought Content: Thought content normal.        Judgment: Judgment normal.        Assessment And Plan:     1. Other fatigue Comments: Will check for metabolic causes. EKG done with NSR HR 72 - EKG 12-Lead - Iron, TIBC and Ferritin Panel - CBC - BMP8+eGFR - TSH - Vitamin B12 - VITAMIN D 25 Hydroxy (Vit-D Deficiency, Fractures)  2. Dizziness Comments: Blood pressure is slightly elevated which can be causing dizziness - EKG 12-Lead  3. Acute nonintractable headache, unspecified headache type Comments: This can be a typical migraine, encouraged to stay well hydrated with water.   4. BMI 32.0-32.9,adult  She is encouraged to strive for BMI less than 30 to decrease cardiac risk. Advised to aim for at least 150 minutes of exercise per week.   Patient was given opportunity to ask questions. Patient verbalized understanding of the plan and was able to repeat key elements of the plan. All questions were answered to their satisfaction.  Minette Brine, FNP   I, Minette Brine, FNP, have reviewed all documentation for this visit. The documentation on 04/16/21 for the exam, diagnosis, procedures, and orders are all accurate and complete.   IF YOU HAVE BEEN REFERRED TO A SPECIALIST, IT MAY TAKE 1-2 WEEKS TO SCHEDULE/PROCESS THE REFERRAL. IF YOU HAVE NOT HEARD FROM US/SPECIALIST IN TWO WEEKS, PLEASE GIVE [place] A CALL AT [phone].   THE PATIENT IS ENCOURAGED TO PRACTICE SOCIAL DISTANCING DUE TO THE COVID-19 PANDEMIC.

## 2021-03-16 LAB — TSH: TSH: 0.943 u[IU]/mL (ref 0.450–4.500)

## 2021-03-16 LAB — BMP8+EGFR
BUN/Creatinine Ratio: 22 (ref 9–23)
BUN: 17 mg/dL (ref 6–24)
CO2: 21 mmol/L (ref 20–29)
Calcium: 9.8 mg/dL (ref 8.7–10.2)
Chloride: 105 mmol/L (ref 96–106)
Creatinine, Ser: 0.76 mg/dL (ref 0.57–1.00)
Glucose: 94 mg/dL (ref 70–99)
Potassium: 4.2 mmol/L (ref 3.5–5.2)
Sodium: 143 mmol/L (ref 134–144)
eGFR: 95 mL/min/{1.73_m2} (ref >59)

## 2021-03-16 LAB — CBC
Hematocrit: 42.9 % (ref 34.0–46.6)
Hemoglobin: 14.6 g/dL (ref 11.1–15.9)
MCH: 31.8 pg (ref 26.6–33.0)
MCHC: 34 g/dL (ref 31.5–35.7)
MCV: 94 fL (ref 79–97)
Platelets: 342 10*3/uL (ref 150–450)
RBC: 4.59 x10E6/uL (ref 3.77–5.28)
RDW: 12.8 % (ref 11.7–15.4)
WBC: 8.6 10*3/uL (ref 3.4–10.8)

## 2021-03-16 LAB — IRON,TIBC AND FERRITIN PANEL
Ferritin: 157 ng/mL — ABNORMAL HIGH (ref 15–150)
Iron Saturation: 41 % (ref 15–55)
Iron: 111 ug/dL (ref 27–159)
Total Iron Binding Capacity: 269 ug/dL (ref 250–450)
UIBC: 158 ug/dL (ref 131–425)

## 2021-03-16 LAB — VITAMIN B12: Vitamin B-12: 1007 pg/mL (ref 232–1245)

## 2021-03-16 LAB — VITAMIN D 25 HYDROXY (VIT D DEFICIENCY, FRACTURES): Vit D, 25-Hydroxy: 73.5 ng/mL (ref 30.0–100.0)

## 2021-09-12 ENCOUNTER — Encounter: Payer: 59 | Admitting: Nurse Practitioner

## 2022-02-09 ENCOUNTER — Encounter: Payer: Self-pay | Admitting: Nurse Practitioner

## 2022-09-23 ENCOUNTER — Other Ambulatory Visit (HOSPITAL_BASED_OUTPATIENT_CLINIC_OR_DEPARTMENT_OTHER): Payer: Self-pay | Admitting: Registered Nurse

## 2022-09-23 DIAGNOSIS — Z8249 Family history of ischemic heart disease and other diseases of the circulatory system: Secondary | ICD-10-CM

## 2022-10-22 ENCOUNTER — Ambulatory Visit (HOSPITAL_COMMUNITY)
Admission: RE | Admit: 2022-10-22 | Discharge: 2022-10-22 | Disposition: A | Discharge status: Home / Self Care | Payer: 59 | Source: Ambulatory Visit | Attending: Registered Nurse | Admitting: Registered Nurse

## 2022-10-22 DIAGNOSIS — Z8249 Family history of ischemic heart disease and other diseases of the circulatory system: Secondary | ICD-10-CM
